# Patient Record
Sex: Female | Born: 1980 | Race: White | Hispanic: No | Marital: Single
Health system: Southern US, Academic
[De-identification: ages and names within clinical notes are randomized; demographics above are authoritative.]

## PROBLEM LIST (undated history)

## (undated) ENCOUNTER — Encounter: Attending: Dermatology | Primary: Dermatology

## (undated) ENCOUNTER — Ambulatory Visit

## (undated) ENCOUNTER — Other Ambulatory Visit

## (undated) ENCOUNTER — Encounter: Attending: Nurse Practitioner | Primary: Nurse Practitioner

## (undated) ENCOUNTER — Ambulatory Visit: Payer: PRIVATE HEALTH INSURANCE | Attending: Medical | Primary: Medical

## (undated) ENCOUNTER — Encounter

## (undated) ENCOUNTER — Encounter
Attending: Student in an Organized Health Care Education/Training Program | Primary: Student in an Organized Health Care Education/Training Program

## (undated) ENCOUNTER — Ambulatory Visit: Payer: Medicaid (Managed Care)

## (undated) ENCOUNTER — Telehealth
Attending: Student in an Organized Health Care Education/Training Program | Primary: Student in an Organized Health Care Education/Training Program

## (undated) ENCOUNTER — Encounter: Payer: PRIVATE HEALTH INSURANCE | Attending: Medical | Primary: Medical

## (undated) ENCOUNTER — Ambulatory Visit: Payer: PRIVATE HEALTH INSURANCE | Attending: Nurse Practitioner | Primary: Nurse Practitioner

## (undated) ENCOUNTER — Encounter: Attending: Medical | Primary: Medical

## (undated) ENCOUNTER — Telehealth

## (undated) ENCOUNTER — Ambulatory Visit: Payer: PRIVATE HEALTH INSURANCE | Attending: Obstetrics & Gynecology | Primary: Obstetrics & Gynecology

## (undated) ENCOUNTER — Encounter: Attending: Registered" | Primary: Registered"

## (undated) ENCOUNTER — Ambulatory Visit: Payer: PRIVATE HEALTH INSURANCE | Attending: Physician Assistant | Primary: Physician Assistant

## (undated) ENCOUNTER — Ambulatory Visit
Payer: Medicaid (Managed Care) | Attending: Student in an Organized Health Care Education/Training Program | Primary: Student in an Organized Health Care Education/Training Program

## (undated) ENCOUNTER — Ambulatory Visit: Payer: PRIVATE HEALTH INSURANCE

## (undated) ENCOUNTER — Telehealth: Attending: Obstetrics & Gynecology | Primary: Obstetrics & Gynecology

## (undated) ENCOUNTER — Telehealth: Attending: Internal Medicine | Primary: Internal Medicine

## (undated) ENCOUNTER — Encounter: Payer: PRIVATE HEALTH INSURANCE | Attending: Registered" | Primary: Registered"

## (undated) ENCOUNTER — Telehealth: Attending: Dermatology | Primary: Dermatology

## (undated) ENCOUNTER — Telehealth: Attending: Medical | Primary: Medical

## (undated) ENCOUNTER — Ambulatory Visit
Payer: PRIVATE HEALTH INSURANCE | Attending: Physical Medicine & Rehabilitation | Primary: Physical Medicine & Rehabilitation

## (undated) ENCOUNTER — Ambulatory Visit: Attending: Registered" | Primary: Registered"

## (undated) ENCOUNTER — Encounter: Payer: PRIVATE HEALTH INSURANCE | Attending: Internal Medicine | Primary: Internal Medicine

## (undated) ENCOUNTER — Encounter: Attending: Internal Medicine | Primary: Internal Medicine

## (undated) ENCOUNTER — Telehealth: Attending: Family | Primary: Family

## (undated) ENCOUNTER — Ambulatory Visit: Attending: Obstetrics & Gynecology | Primary: Obstetrics & Gynecology

## (undated) ENCOUNTER — Ambulatory Visit: Payer: Medicaid (Managed Care) | Attending: Obstetrics & Gynecology | Primary: Obstetrics & Gynecology

## (undated) ENCOUNTER — Encounter: Attending: Physician Assistant | Primary: Physician Assistant

## (undated) ENCOUNTER — Ambulatory Visit: Attending: Nurse Practitioner | Primary: Nurse Practitioner

## (undated) ENCOUNTER — Ambulatory Visit
Payer: PRIVATE HEALTH INSURANCE | Attending: Student in an Organized Health Care Education/Training Program | Primary: Student in an Organized Health Care Education/Training Program

## (undated) ENCOUNTER — Encounter: Payer: Medicaid (Managed Care) | Attending: Registered" | Primary: Registered"

## (undated) ENCOUNTER — Telehealth: Attending: Psychiatry | Primary: Psychiatry

## (undated) DIAGNOSIS — E119 Type 2 diabetes mellitus without complications: Secondary | ICD-10-CM

---

## 2006-08-01 ENCOUNTER — Emergency Department: Payer: Self-pay | Admitting: Emergency Medicine

## 2006-12-31 ENCOUNTER — Emergency Department: Payer: Self-pay | Admitting: Emergency Medicine

## 2012-05-13 ENCOUNTER — Emergency Department: Payer: Self-pay | Admitting: Emergency Medicine

## 2012-05-13 LAB — DIFFERENTIAL
Basophil %: 0.1 %
Eosinophil #: 0 10*3/uL (ref 0.0–0.7)
Monocyte #: 1.5 x10 3/mm — ABNORMAL HIGH (ref 0.2–0.9)
Monocyte %: 11.5 %
Neutrophil %: 81.1 %

## 2012-05-13 LAB — CBC
HCT: 35.4 % (ref 35.0–47.0)
MCH: 28.9 pg (ref 26.0–34.0)
MCHC: 33.1 g/dL (ref 32.0–36.0)
MCV: 87 fL (ref 80–100)
Platelet: 227 10*3/uL (ref 150–440)
RDW: 14.5 % (ref 11.5–14.5)
WBC: 13.2 10*3/uL — ABNORMAL HIGH (ref 3.6–11.0)

## 2012-05-13 LAB — URINALYSIS, COMPLETE
Glucose,UR: NEGATIVE mg/dL (ref 0–75)
Ph: 5 (ref 4.5–8.0)
Protein: 30
RBC,UR: 6 /HPF (ref 0–5)
Specific Gravity: 1.029 (ref 1.003–1.030)
Squamous Epithelial: 2
WBC UR: 6 /HPF (ref 0–5)

## 2012-05-13 LAB — BASIC METABOLIC PANEL
Anion Gap: 9 (ref 7–16)
BUN: 8 mg/dL (ref 7–18)
Calcium, Total: 8.6 mg/dL (ref 8.5–10.1)
Chloride: 101 mmol/L (ref 98–107)
Creatinine: 0.68 mg/dL (ref 0.60–1.30)
EGFR (African American): >60
Glucose: 102 mg/dL — ABNORMAL HIGH (ref 65–99)
Potassium: 3.8 mmol/L (ref 3.5–5.1)
Sodium: 133 mmol/L — ABNORMAL LOW (ref 136–145)

## 2012-05-13 LAB — HEPATIC FUNCTION PANEL A (ARMC)
Albumin: 2.9 g/dL — ABNORMAL LOW (ref 3.4–5.0)
Bilirubin,Total: 0.3 mg/dL (ref 0.2–1.0)
SGOT(AST): 16 U/L (ref 15–37)
Total Protein: 7.1 g/dL (ref 6.4–8.2)

## 2012-05-16 ENCOUNTER — Emergency Department: Payer: Self-pay | Admitting: Internal Medicine

## 2012-05-16 LAB — CBC
HGB: 11.4 g/dL — ABNORMAL LOW (ref 12.0–16.0)
MCHC: 32.1 g/dL (ref 32.0–36.0)
RBC: 4.05 10*6/uL (ref 3.80–5.20)
RDW: 14.6 % — ABNORMAL HIGH (ref 11.5–14.5)

## 2012-08-21 ENCOUNTER — Emergency Department: Payer: Self-pay | Admitting: Emergency Medicine

## 2012-08-21 LAB — URINALYSIS, COMPLETE
Bacteria: NONE SEEN
Bilirubin,UR: NEGATIVE
Glucose,UR: NEGATIVE mg/dL (ref 0–75)
Ketone: NEGATIVE
Leukocyte Esterase: NEGATIVE
RBC,UR: 5 /HPF (ref 0–5)
Squamous Epithelial: 3
WBC UR: 1 /HPF (ref 0–5)

## 2012-08-21 LAB — BASIC METABOLIC PANEL
BUN: 8 mg/dL (ref 7–18)
Calcium, Total: 8.8 mg/dL (ref 8.5–10.1)
Co2: 24 mmol/L (ref 21–32)
EGFR (African American): >60
Sodium: 139 mmol/L (ref 136–145)

## 2012-08-21 LAB — CBC
HGB: 11.9 g/dL — ABNORMAL LOW (ref 12.0–16.0)
MCH: 28.7 pg (ref 26.0–34.0)
Platelet: 257 10*3/uL (ref 150–440)
RBC: 4.13 10*6/uL (ref 3.80–5.20)
RDW: 14.6 % — ABNORMAL HIGH (ref 11.5–14.5)
WBC: 14.8 10*3/uL — ABNORMAL HIGH (ref 3.6–11.0)

## 2013-11-18 ENCOUNTER — Ambulatory Visit: Payer: Self-pay | Admitting: Physician Assistant

## 2013-11-30 ENCOUNTER — Emergency Department: Payer: Self-pay | Admitting: Emergency Medicine

## 2013-11-30 LAB — RAPID INFLUENZA A&B ANTIGENS

## 2013-12-25 ENCOUNTER — Encounter: Payer: Self-pay | Admitting: Obstetrics and Gynecology

## 2013-12-25 LAB — BASIC METABOLIC PANEL
Anion Gap: 8 (ref 7–16)
BUN: 5 mg/dL — AB (ref 7–18)
CO2: 24 mmol/L (ref 21–32)
CREATININE: 0.43 mg/dL — AB (ref 0.60–1.30)
Calcium, Total: 8.4 mg/dL — ABNORMAL LOW (ref 8.5–10.1)
Chloride: 105 mmol/L (ref 98–107)
EGFR (African American): >60
Glucose: 71 mg/dL (ref 65–99)
OSMOLALITY: 270 (ref 275–301)
Potassium: 4.1 mmol/L (ref 3.5–5.1)
Sodium: 137 mmol/L (ref 136–145)

## 2013-12-25 LAB — TSH: Thyroid Stimulating Horm: 2.09 u[IU]/mL

## 2013-12-28 ENCOUNTER — Encounter: Payer: Self-pay | Admitting: Obstetrics and Gynecology

## 2014-01-05 ENCOUNTER — Encounter: Payer: Self-pay | Admitting: Obstetrics and Gynecology

## 2014-01-26 ENCOUNTER — Ambulatory Visit: Payer: Self-pay | Admitting: Obstetrics and Gynecology

## 2014-01-26 ENCOUNTER — Encounter: Payer: Self-pay | Admitting: Maternal and Fetal Medicine

## 2014-02-18 ENCOUNTER — Ambulatory Visit: Payer: Self-pay | Admitting: Obstetrics and Gynecology

## 2014-03-23 ENCOUNTER — Inpatient Hospital Stay: Payer: Self-pay

## 2014-03-23 LAB — CBC WITH DIFFERENTIAL/PLATELET
Basophil #: 0.1 10*3/uL (ref 0.0–0.1)
Basophil %: 0.4 %
Eosinophil #: 0.2 10*3/uL (ref 0.0–0.7)
Eosinophil %: 1.8 %
HCT: 32.3 % — ABNORMAL LOW (ref 35.0–47.0)
HGB: 10.3 g/dL — AB (ref 12.0–16.0)
Lymphocyte #: 2 10*3/uL (ref 1.0–3.6)
Lymphocyte %: 15.3 %
MCH: 27.1 pg (ref 26.0–34.0)
MCHC: 32 g/dL (ref 32.0–36.0)
MCV: 85 fL (ref 80–100)
Monocyte #: 0.8 x10 3/mm (ref 0.2–0.9)
Monocyte %: 5.8 %
Neutrophil #: 10.2 10*3/uL — ABNORMAL HIGH (ref 1.4–6.5)
Neutrophil %: 76.7 %
Platelet: 258 10*3/uL (ref 150–440)
RBC: 3.81 10*6/uL (ref 3.80–5.20)
RDW: 16.2 % — AB (ref 11.5–14.5)
WBC: 13.2 10*3/uL — ABNORMAL HIGH (ref 3.6–11.0)

## 2014-03-25 LAB — HEMATOCRIT: HCT: 24.2 % — AB (ref 35.0–47.0)

## 2014-07-13 ENCOUNTER — Ambulatory Visit: Payer: Self-pay

## 2015-01-06 ENCOUNTER — Observation Stay: Payer: Self-pay | Admitting: Surgery

## 2015-01-08 ENCOUNTER — Ambulatory Visit: Payer: Self-pay | Admitting: Surgery

## 2015-03-20 NOTE — Consult Note (Signed)
Referral Information:  Reason for Referral Elevated BMI 47, 2 cm chronic labial lesion, smoker, abnormal tetra screen increased DSR   Referring Physician [facility]   Prenatal Hx 34 yo G4 p2012 unsure LMP 06/17/13,  [place] 03/30/14  by 21 1/7 on 12/23 /14 week [facility] scan now 26 3/7 close interval pregnancy  labial lesion noted at new ob - pt says it has been present for year s now, enlarging red, some discomfort with clothes and occ intercourse . biopsy in 2013 no malignant changes pt has a h/o hidradenitis suppurativa and I&D of pilonidal cyst- this is different. She reports inability to lose weight after big weight gain with first pregnancy. difficulty with smoking cessation. Hoping to get teeth fixed now that she has medicaid.   Past Obstetrical Hx 10/2000 female 7lb 15 oz spontaneous vaginal delivery [facility] 180 prepregnancy "Gained 100 lbs" 34 yo - nags her to quit smoking 6/201 SAB 5 weeks  03/2013 female 7 lb 13oz induction post dates [facility], failed epidural persistent back ache   Home Medications: Medication Instructions Status  prenatal vitamins 1   once a day Active   Allergies:   No Known Allergies:   Vital Signs/Notes:  Nursing Vital Signs: **Vital Signs.:   29-Jan-15 09:42  Vital Signs Type Routine; Perinatal clinic  Temperature Temperature (F) 97.5  Celsius 36.3  Pulse Pulse 112  Respirations Respirations 20  Systolic BP Systolic BP 272  Diastolic BP (mmHg) Diastolic BP (mmHg) 67  Mean BP 80  Pulse Ox % Pulse Ox % 97  Oxygen Delivery Room Air/ 21 %   Perinatal Consult:  LMP 17-Jun-2013   PGyn Hx labial lesion   PMed Hx Rubella Immune, Hx of varicella   Past Medical History cont'd h/o depression/ ADHD h/o gall bladder problems no stones just pain  h/o "tailbone cyst" requiring lancing  bad teeth - plannign on seeing a dentist  h/o smoking 1/2 ppd  h/u UTIs sleep apnea- had a CPAP machine at one point , snores   back pain relates to epidural   PSurg Hx I&D of cyst   FHx  htn , pos ppd, diabetes   Occupation Father recovery from ETOH went through a program while incarcerated- trying to decrease smoking down from 2ppd to 1 ppd   Soc Hx single, tobacco, committed relationship   Review Of Systems:  Medications/Allergies Reviewed Medications/Allergies reviewed   Exam:  Pelvic External left labia major at 5oclock 26m cystic structure subdermal jsut belwo surface    Additional Lab/Radiology Notes A pos antibody screen neg RPR neg, hep B neg  GC/CT neg varicella imm , rub imm pt reports normal early GCT   abnormal Tetra 1/250 DSR   Impression/Recommendations:  Impression IUP at 26 weeks ,prior good pregnancy outcomes obesity - BMI 47, h/o sleep apnea , normal early GCT , has another scheduled  labial lesion- benign appearing very superficial just below skin - not bartholins , annoying to pt rubs on clothes  smoker 1/2 ppd did better with Chantix, failed patches  - partner smokes 1ppd Increased risk for down syndrome 1/250 range met with gen counselor h/o back pain   Recommendations [facility] -advised to limit weight gain  1Check TSH , CMP p/c ratio as baseline- done today  2 Third tri glucola at [facility]  3 monthly u/s in third tri given inability to follow FH- scheduled in 4 weeks  4 recommended sleep study given high bmi and association of OSA  with pregnancy complications 5 I offered her a daily baby aspirin likely too late for preex prevention but may assist with increased risk of VTE 6 anesthesia consult to assess airway- pt refuses epidural 7 Consider resection of benign appearing  labial subdermal cystic  lesion with local at time of delivery  . 7seek dental care for poor dentition 8 needs contraceptive counseling   Plan:  Genetic Counseling yes   Ultrasound at what gestational ages Monthly > 28 weeks   Antepartum Testing Weekly, 36 weeks   Delivery Mode Vaginal   Additional Testing Thyroid  panel, glucola   Delivery at what gestational age [redacted] weeks    Total Time Spent with Patient 30 minutes   >50% of visit spent in couseling/coordination of care yes   Office Use Only 99242  Level 2 (25mn) NEW office consult exp prob focused   Coding Description: MATERNAL CONDITIONS/HISTORY INDICATION(S).   Obesity - BMI greater than equal to 30.  Electronic Signatures: LSharyn Creamer(MD)  (Signed 29-Jan-15 14:09)  Authored: Referral, Home Medications, Allergies, Vital Signs/Notes, Consult, Exam, Lab/Radiology Notes, Impression, Plan, Billing, Coding Description   Last Updated: 29-Jan-15 14:09 by LSharyn Creamer(MD)

## 2015-03-22 LAB — SURGICAL PATHOLOGY

## 2015-03-28 NOTE — H&P (Signed)
Subjective/Chief Complaint severe abdominal pain   History of Present Illness 34 y/o with long history of intermittent epigatsric pain which radiated into her right back. made herself vomit, took percocet 5/325 1-2 tabs po q 4-6 hr prn pain left over from surgery 1 month ago.  2 days ago severe pain similar which resolved.  Awoke at 4 am with the worst pain she has ever had.  came to ER.   Past History obesity s/p LEEP procedure.   Past Medical Health None   Primary Physician none   Code Status Full Code   Past Med/Surgical Hx:  miscarrage june 2013 at 5 wks:   LEEP:   ALLERGIES:  No Known Allergies:   Family and Social History:  Family History Hypertension   Social History positive  tobacco, negative ETOH, negative Illicit drugs, 1/2 to 1 pack per day   + Tobacco Current (within 1 year)   Place of Living Home   Review of Systems:  Subjective/Chief Complaint see above.   Abdominal Pain Yes   Nausea/Vomiting Yes   Tolerating Diet No  Nauseated   ROS remaining 10 point negative.   Medications/Allergies Reviewed Medications/Allergies reviewed   Physical Exam:  GEN no acute distress, obese, disheveled, 97.6 76 108/73   HEENT pale conjunctivae, PERRL   NECK supple   RESP normal resp effort   CARD no carotid bruits   ABD positive tenderness  denies Flank Tenderness  no hernia  soft  positive murphy's sign.   LYMPH negative neck   EXTR negative cyanosis/clubbing   SKIN normal to palpation, No rashes   NEURO cranial nerves intact   PSYCH A+O to time, place, person   Lab Results: Hepatic:  10-Feb-16 07:07   Bilirubin, Total 0.2  Alkaline Phosphatase 97  SGPT (ALT) 23  SGOT (AST)  12  Total Protein, Serum 7.2  Albumin, Serum  3.1  Routine Chem:  10-Feb-16 07:07   Lipase 148 (Result(s) reported on 06 Jan 2015 at 08:16AM.)  Glucose, Serum  120  BUN 13  Creatinine (comp) 0.89  Sodium, Serum 138  Potassium, Serum 4.3  Chloride, Serum 106  CO2,  Serum 25  Calcium (Total), Serum  8.4  Osmolality (calc) 277  eGFR (African American) >60  eGFR (Non-African American) >60 (eGFR values <36m/min/1.73 m2 may be an indication of chronic kidney disease (CKD). Calculated eGFR, using the MRDR Study equation, is useful in  patients with stable renal function. The eGFR calculation will not be reliable in acutely ill patients when serum creatinine is changing rapidly. It is not useful in patients on dialysis. The eGFR calculation may not be applicable to patients at the low and high extremes of body sizes, pregnant women, and vegetarians.)  Anion Gap 7  Cardiac:  10-Feb-16 07:07   Troponin I < 0.02 (0.00-0.05 0.05 ng/mL or less: NEGATIVE  Repeat testing in 3-6 hrs  if clinically indicated. >0.05 ng/mL: POTENTIAL  MYOCARDIAL INJURY. Repeat  testing in 3-6 hrs if  clinically indicated. NOTE: An increase or decrease  of 30% or more on serial  testing suggests a  clinically important change)  Routine UA:  10-Feb-16 07:07   Color (UA) Yellow  Clarity (UA) Clear  Glucose (UA) Negative  Bilirubin (UA) Negative  Ketones (UA) Negative  Specific Gravity (UA) 1.029  Blood (UA) Negative  pH (UA) 6.0  Protein (UA) Negative  Nitrite (UA) Negative  Leukocyte Esterase (UA) Negative (Result(s) reported [facility] 06 Jan 2015 [facility])  RBC (UA) 3 /HPF  WBC (UA) <1 /HPF  Bacteria (UA) NONE SEEN  Epithelial Cells (UA) 4 /HPF  Mucous (UA) PRESENT (Result(s) reported on 06 Jan 2015 at [facility].)  Routine Hem:  10-Feb-16 07:07   WBC (CBC)  15.2  RBC (CBC) 4.69  Hemoglobin (CBC) 12.1  Hematocrit (CBC) 37.4  Platelet Count (CBC) 305 (Result(s) reported on 06 Jan 2015 at 08:09AM.)  MCV 80  MCH  25.7  MCHC 32.3  RDW  16.9   Radiology Results: [place]:    10-Feb-16 08:53, US Abdomen Limited Survey  US Abdomen Limited Survey  REASON FOR EXAM:    uppwr abd and RUQ pain eval GB  COMMENTS:   Body Site: Gallbladder, Liver, Common Bile  Duct    PROCEDURE: [place]  - US ABDOMEN LIMITED SURVEY  - Jan 06 2015  8:53AM     CLINICAL DATA:  Right upper quadrant pain since 04/30 this morning  with nausea and vomiting    EXAM:  US ABDOMEN LIMITED - RIGHT UPPER QUADRANT    COMPARISON:  None.    FINDINGS:  Gallbladder:  There are multiple mobile gallstones. There is a stone present in  the gallbladder neck which is non mobile. The gallbladder wall is  top normal at 2.9 mm. There is no pericholecystic fluid. A  sonographic Murphy's sign could not be adequately assessed due to  the patient's medicated status.    Common bile duct:    Diameter: 7.5 mm which is at the upper limits of normal.    Liver:    The hepatic echotexture is mildly increased. There is no focal mass  or intrahepatic ductal dilation.   IMPRESSION:  Multiple mobile gallstones within an apparently impacted gallstone  in the gallbladder neck. Top-normal common bile duct diameter  without evidence of an intraluminal stone. Fatty infiltrative change  of the liver.      Electronically Signed    By: David  Martinique    On: 01/06/2015 08:59         Verified By: DAVID A. Martinique, M.D., MD  LabUnknown:  PACS Image    Assessment/Admission Diagnosis 34 y/o female with acute calculus cholecystitis stone lodged in neck of GB.   Plan admit, interval laparoscopic cholecystectomy. IV unasyn.   Electronic Signatures: Sherri Rad (MD)  (Signed 10-Feb-16 14:24)  Authored: CHIEF COMPLAINT and HISTORY, PAST MEDICAL/SURGIAL HISTORY, ALLERGIES, FAMILY AND SOCIAL HISTORY, REVIEW OF SYSTEMS, PHYSICAL EXAM, LABS, Radiology, ASSESSMENT AND PLAN   Last Updated: 10-Feb-16 14:24 by Sherri Rad (MD)

## 2015-03-28 NOTE — Op Note (Signed)
PATIENT NAME:  [name], [name] MR#:  [id number] DATE OF BIRTH:  12-06-1980  DATE OF PROCEDURE:  01/08/2015  PREOPERATIVE DIAGNOSIS: Biliary colic  POSTOPERATIVE DIAGNOSIS: Biliary colic.   PROCEDURE PERFORMED: Laparoscopic cholecystectomy.   SURGEON: Oluwatosin Bracy A. Egbert GaribaldiBird, MD   ASSISTANT: Claude MangesWilliam F. Marterre, MD  ANESTHESIA: General oroendotracheal.   FINDINGS: Nearly intrahepatic gallbladder, cholelithiasis.   SPECIMENS: Gallbladder with contents to pathology.   ESTIMATED BLOOD LOSS: 100 mL.   DRAINS: None.   COUNTS: LAP and needle count correct x 2.   DESCRIPTION OF PROCEDURE: With informed consent obtained from the patient, she was brought to the operating room and positioned supine. General oroendotracheal anesthesia was induced without difficulty. The patient's abdomen was widely and sterilely prepped and draped with ChloraPrep solution and a timeout was observed. A 12 mm blunt Hassan trocar was placed through an open technique through a supraumbilical, vertically oriented midline skin incision. The fascia was incised with a scalpel and elevated with Kocher clamps. Peritoneum being entered sharply the posterior layer. Stay sutures of 0 Vicryl were placed on either side of the abdominal midline fascia securing a 12 mm blunt Hassan trocar placed under direct visualization and pneumoperitoneum was established to a total of 15 mmHg. The patient was positioned in reverse Trendelenburg and airplane right side up. A 5 mm Bladeless trocar was placed in the epigastric region and two 5 mm trocars were then placed in the right lateral abdomen. The gallbladder was bilobed and intrahepatic. It was elevated towards the right liver, over the right liver and shoulder with a grasper on the midportion. Lateral traction was achieved on the infundibulum. The hepatoduodenal ligament was then incised with blunt technique with hook cautery apparatus utilizing the peritoneal layer both medially and laterally. The hilar  plate was identified. Cystic duct and cystic artery were identified. Critical view of safety cholecystectomy was achieved. There was a posterior branch of the cystic artery. The cystic duct was triply clipped on the portal side, singly clipped on the gallbladder side, and divided. The cystic artery was then individually divided in both anterior and posterior branches with hemoclips. The gallbladder was then retrieved off the gallbladder fossa utilizing hook cautery apparatus with some difficulty in tearing of the liver capsule due to it being intrahepatic. Hemostasis was obtained with the argon beam coagulator device. Surgiflo with thrombin was applied. Hemostasis appeared to be excellent. The gallbladder was retrieved with an Endo Catch device and retrieved successfully. With hemostasis being ensured on the operative field, all fluid was aspirated. Ports were then removed under direct visualization. The supraumbilical fascial defect was closed with multiple simple and figure-of-eight #0 Vicryl sutures. The existing stay sutures tied to each other. A total of 20 mL of 0.25% plain Marcaine was infiltrated along all skin and fascial incisions prior to closure; 4-0 Vicryl subcuticular was applied to all skin edges followed by the application of benzoin, Steri-Strips, Telfa, and Tegaderm. The patient was subsequently extubated and taken to the recovery room in stable and satisfactory condition by anesthesia services.     ____________________________ Redge GainerMark A. Egbert GaribaldiBird, MD mab:bm D: 01/09/2015 17:19:23 ET T: 01/09/2015 23:02:24 ET JOB#: [id number]  cc: Loraine LericheMark A. Egbert GaribaldiBird, MD, <Dictator> Raynald KempMARK A Joeanthony Seeling MD ELECTRONICALLY SIGNED 01/12/2015 13:56

## 2015-04-06 NOTE — H&P (Signed)
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   Gravida 4   Term 2   PreTerm 0   Abortion 1   Living 2   Blood Type (Maternal) A positive   Group B Strep Results Maternal (Result >5wks must be treated as unknown) negative   Maternal HIV Negative   Maternal Syphilis Ab Nonreactive   Maternal Varicella Immune   Rubella Results (Maternal) immune   Maternal T-Dap Immune   EDC 24-Mar-2014   Presents with contractions, for induction   Patient's Medical History Diabetes  gest, obesity   Patient's Surgical History none   Medications Pre Natal Vitamins   Allergies NKDA   Social History tobacco  1/2 ppd at least   Family History Non-Contributory   Current Prenatal Course Notable For gestational diabetes, obesity   ROS:  ROS All systems were reviewed.  HEENT, CNS, GI, GU, Respiratory, CV, Renal and Musculoskeletal systems were found to be normal.   Exam:  Vital Signs stable   General no apparent distress   Mental Status clear   Chest clear   Heart normal sinus rhythm   Abdomen gravid, non-tender   Estimated Fetal Weight Large for gestational age   Fetal Position v   Fundal Height unable to measure   Back no CVAT   Edema 1+   Reflexes 1+   Pelvic no external lesions   Mebranes Intact   Description clear   FHT normal rate with no decels    Fetal Heart Rate 150   Ucx irregular   Skin dry   Lymph no lymphadenopathy   Impression:  Impression admit for induction   Plan:  Plan monitor contractions and for cervical change   Comments admit and start pitocin and check clucoses every two hours until in ,.labor and then q1 hr,   Follow Up Appointment need to schedule. in 6 weeks   Electronic Signatures: Adria DevonKlett, Hyland Mollenkopf (MD)  (Signed 27-Apr-15 09:12)  Authored: L&D Evaluation   Last Updated: 27-Apr-15 09:12 by Adria DevonKlett, Jovon Winterhalter (MD)

## 2015-10-30 IMAGING — US US OB US >=[ID] SNGL FETUS
1 series · 13 of 28 positions shown · non-contrast
Comparison: none

CLINICAL DATA: Pregnancy.

EXAM:
ULTRASOUND OB >=5GCCV SINGLE FETUS

[Series 1: us ob us >=(id) sngl fetus · 0.22mm/px · 13 of 80 slices shown]
[im 3/80]
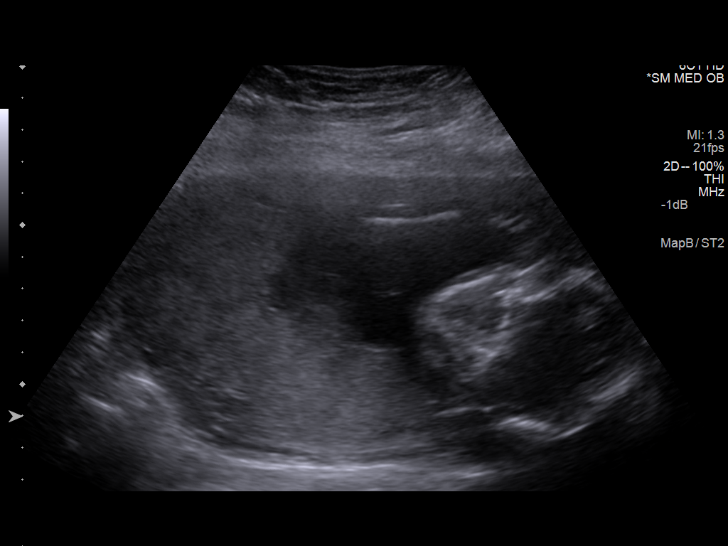
[im 9/80]
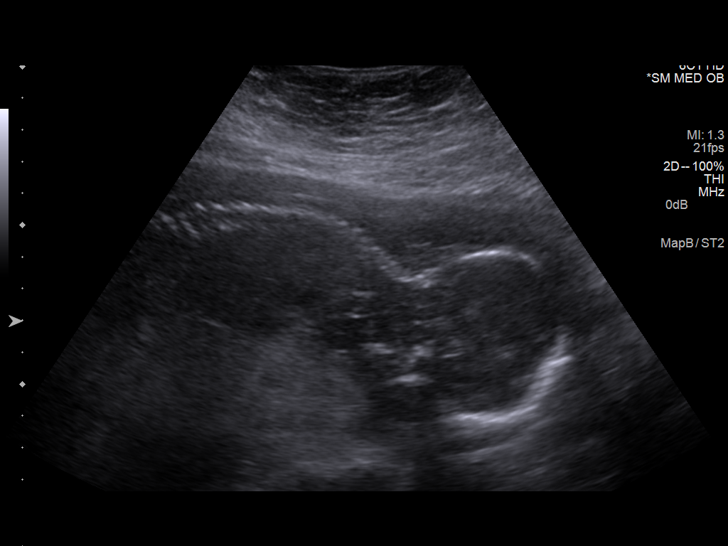
[im 15/80]
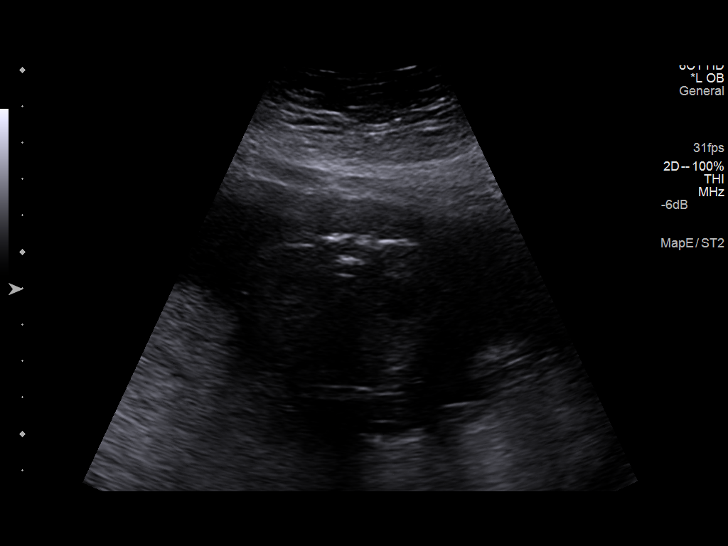
[im 21/80]
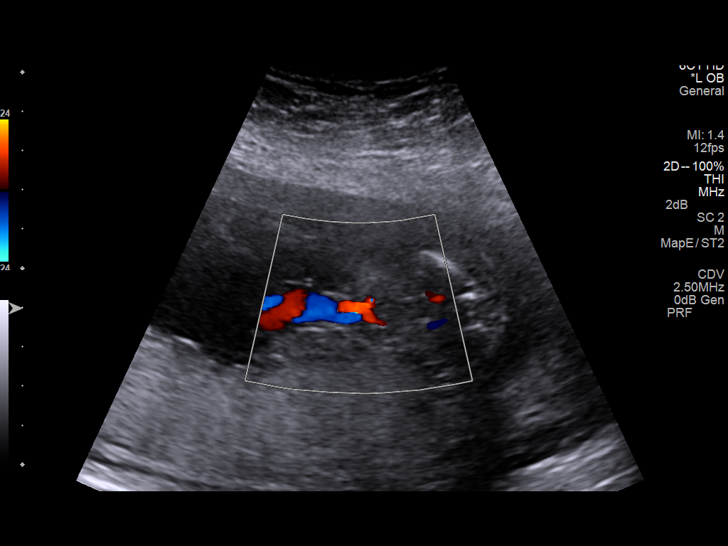
[im 27/80]
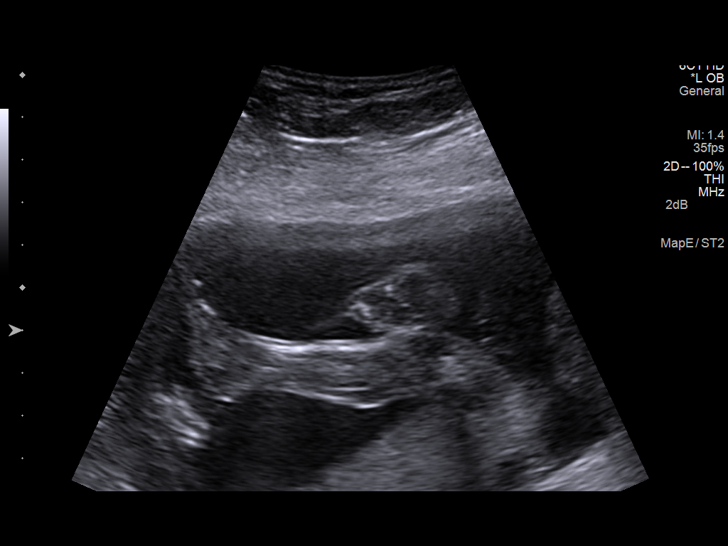
[im 33/80]
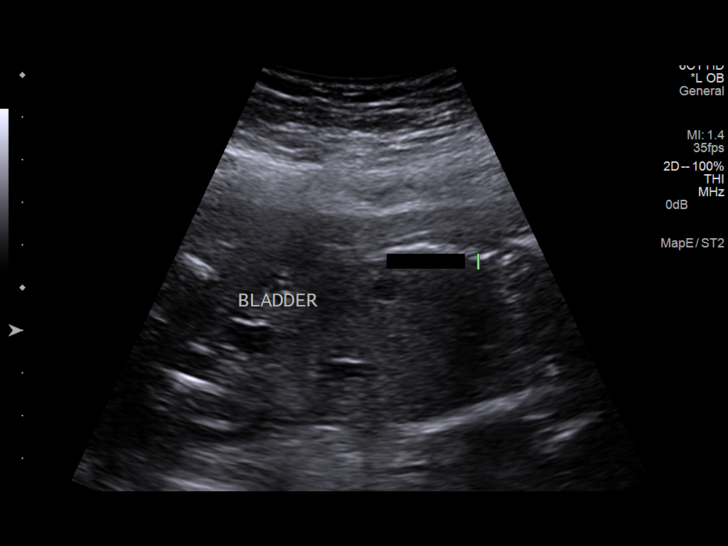
[im 41/80]
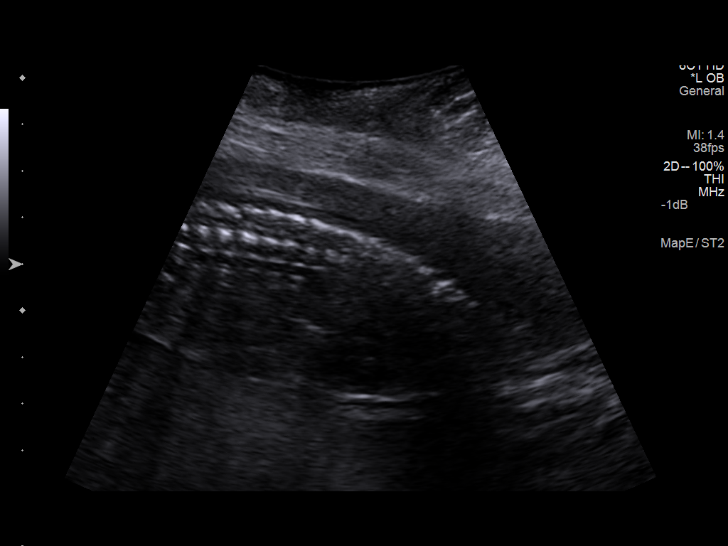
[im 47/80]
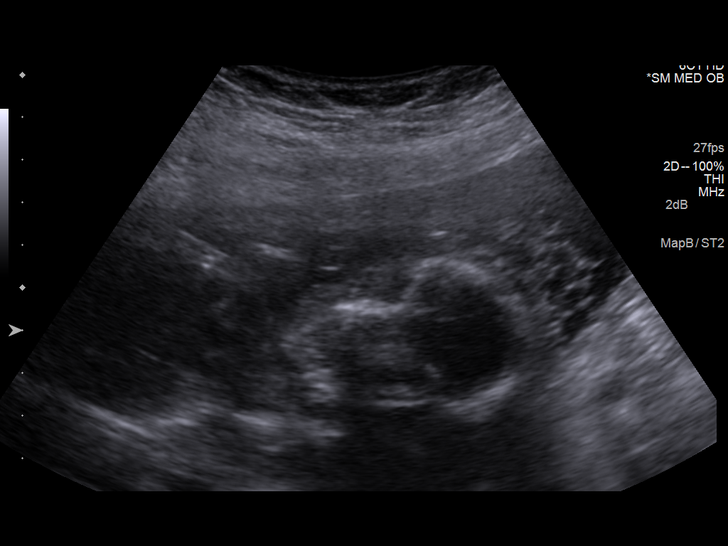
[im 53/80]
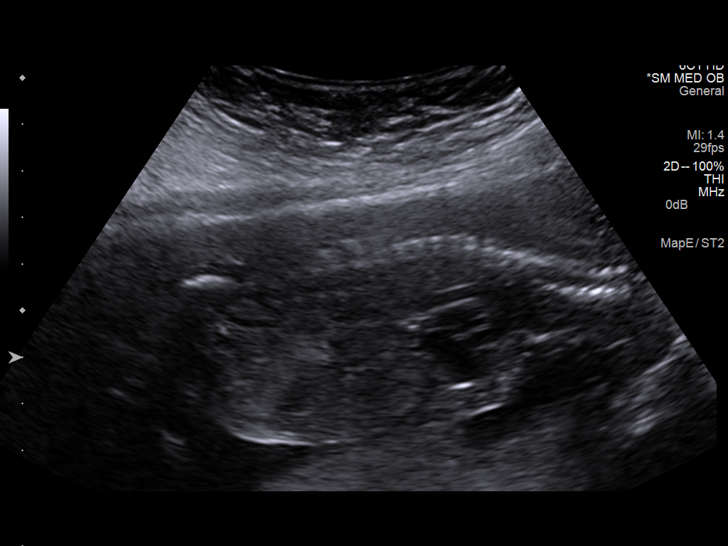
[im 59/80]
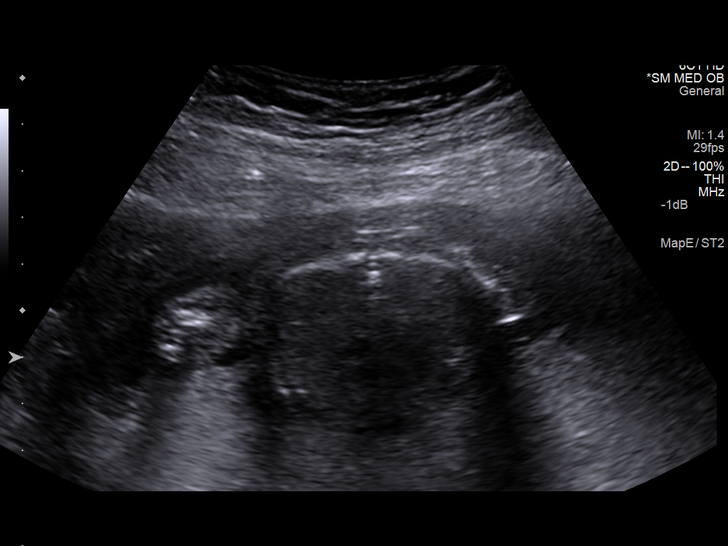
[im 65/80]
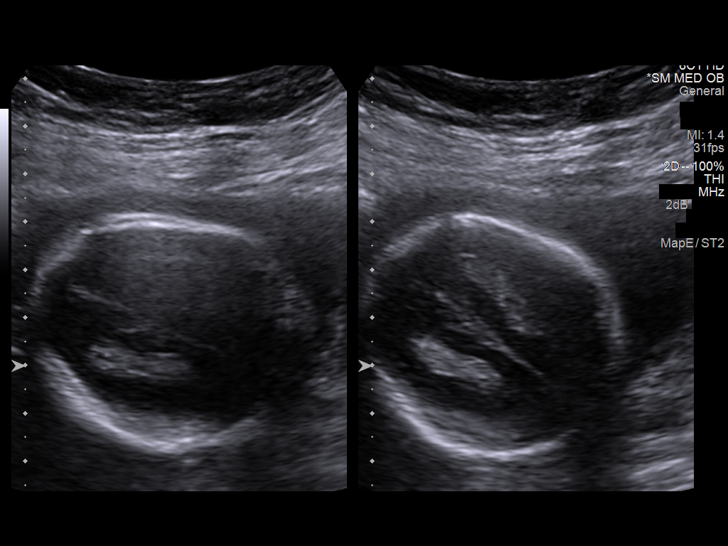
[im 71/80]
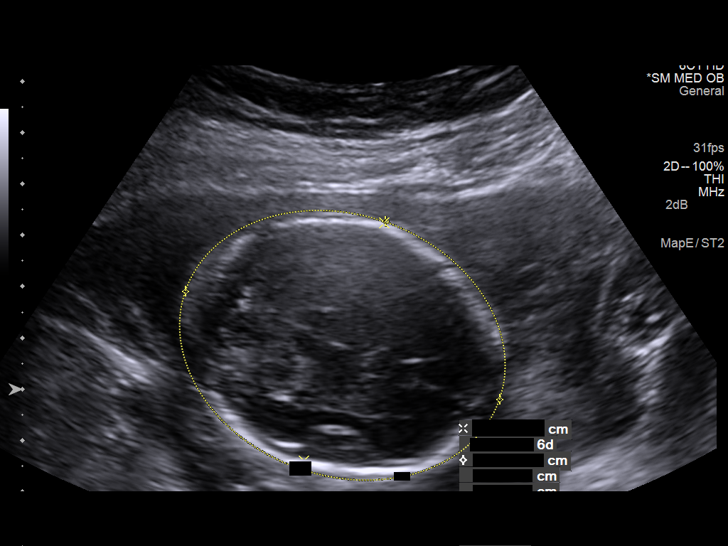
[im 77/80]
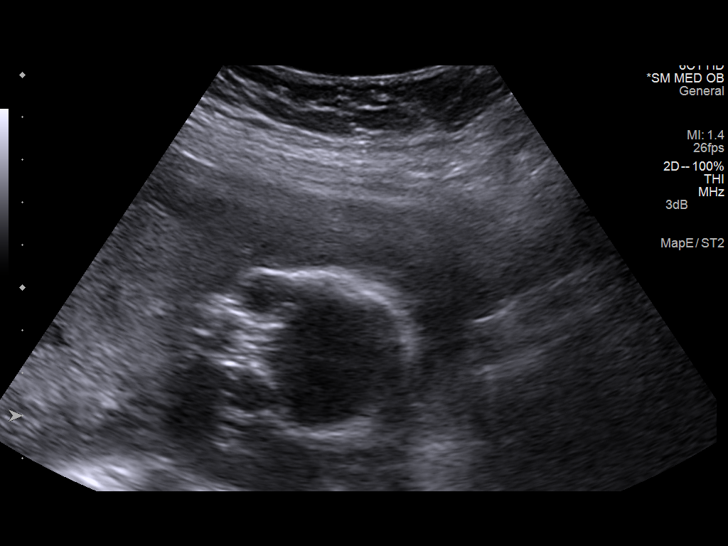

[13 of 28 positions shown; findings below may reference images not displayed]

FINDINGS: Single viable intrauterine pregnancy is present with active cardiac,
trauma, extremity motion. Fetal heart rate 155 beats per min noted.
Fetal presentation is transverse. Placenta is posterior. Cervical
length is 5.5 cm. Placenta cervix distant 6.4 cm. Amniotic fluid
volume is normal. Visualized fetal head, spine, chest, abdomen, and
pelvis are normal. This is extremely limited exam due to patient's
size. Given patient's morbid obesity ultrasound at a tertiary care
center should be considered.

Fetal gestational age parameters included mean BPD 4.9 cm
corresponding to 20 weeks 5 days, head circumference 18.4 cm
corresponding to 20 weeks 5 days, abdominal circumference 16.4 cm
corresponding to 20 weeks 3 days, femur length 3.5 cm corresponding
to 21 weeks 1 day, humeral length 3.4 cm corresponding to 21 weeks 3
days.
IMPRESSION: Single viable intrauterine pregnancy in transverse presentation with
mean gestational age by ultrasound of 21 weeks weeks 1 day. This is
extremely limited exam due to patient's morbid obesity. Ultrasound
at a tertiary care center should be considered.

## 2016-12-18 ENCOUNTER — Encounter: Payer: Self-pay | Admitting: Emergency Medicine

## 2016-12-18 ENCOUNTER — Emergency Department
Admission: EM | Admit: 2016-12-18 | Discharge: 2016-12-18 | Disposition: A | Discharge status: Home / Self Care | Payer: Medicaid Other | Attending: Emergency Medicine | Admitting: Emergency Medicine

## 2016-12-18 DIAGNOSIS — K0889 Other specified disorders of teeth and supporting structures: Secondary | ICD-10-CM

## 2016-12-18 DIAGNOSIS — K029 Dental caries, unspecified: Secondary | ICD-10-CM | POA: Insufficient documentation

## 2016-12-18 MED ORDER — AMOXICILLIN 500 MG PO CAPS: 500.0000 mg | ORAL_CAPSULE | Freq: Three times a day (TID) | ORAL | 0 refills | Status: AC

## 2016-12-18 MED ORDER — TRAMADOL HCL 50 MG PO TABS: 50.0000 mg | ORAL_TABLET | Freq: Two times a day (BID) | ORAL | 0 refills | Status: AC | PRN

## 2016-12-18 NOTE — ED Provider Notes (Signed)
[facility] Emergency Department Provider Note  ____________________________________________  Time seen: Approximately 8:03 PM  I have reviewed the triage vital signs and the nursing notes.   HISTORY  Chief Complaint Dental Pain    HPI [name] [name] is a 36 y.o. female presenting with superior 09, 10 and 11 pain. Patient has noticed gingival erythema and edema surrounding aforementioned teeth. She has been afebrile. She has a dental appointment for 12/27/2016. Patient currently rates her pain at 9 out of 10 in intensity. Patient states that teeth pain is impacting her activities of daily living. She denies chest pain, shortness of breath, nausea, abdominal pain and vomiting. Patient has tried Listerine but has attempted no other alleviating measures.   History reviewed. No pertinent past medical history.  There are no active problems to display for this patient.   No past surgical history on file.  Prior to Admission medications   Medication Sig Start Date End Date Taking? Authorizing Provider  amoxicillin (AMOXIL) 500 MG capsule Take 1 capsule (500 mg total) by mouth 3 (three) times daily. 12/18/16 12/28/16  Orvil Feil, PA-C  traMADol (ULTRAM) 50 MG tablet Take 1 tablet (50 mg total) by mouth every 12 (twelve) hours as needed. 12/18/16 12/23/16  Orvil Feil, PA-C    Allergies Patient has no known allergies.  No family history on file.  Social History Social History  Substance Use Topics  . Smoking status: Not on file  . Smokeless tobacco: Not on file  . Alcohol use Not on file     Review of Systems  Constitutional: No fever/chills ENT: Patient has superior 09,10 and 11 pain.  Cardiovascular: no chest pain. Respiratory: no cough. No SOB. Skin: She has no edema of the skin overlying the jaw bilaterally. Neurological: Negative for headaches, focal weakness or numbness.   ____________________________________________   PHYSICAL  EXAM:  VITAL SIGNS: ED Triage Vitals  Enc Vitals Group     BP 12/18/16 1835 125/88     Pulse Rate 12/18/16 1835 88     Resp 12/18/16 1835 18     Temp 12/18/16 1835 98 F (36.7 C)     Temp Source 12/18/16 1835 Oral     SpO2 12/18/16 1835 96 %     Weight 12/18/16 1835 300 lb (136.1 kg)     Height 12/18/16 1835 5\' 4"  (1.626 m)     Head Circumference --      Peak Flow --      Pain Score 12/18/16 1836 9     Pain Loc --      Pain Edu? --      Excl. in GC? --      Constitutional: Alert and oriented. Well appearing and in no acute distress.      Mouth/Throat: Mucous membranes are moist. Patient has gingival erythema and mild edema surrounding superior 9, 10 and 11. Dental carries visualized. Neck: FROM. No cervical lymphadenopathy. Cardiovascular: Normal rate, regular rhythm. Normal S1 and S2.  Good peripheral circulation. Respiratory: Normal respiratory effort without tachypnea or retractions. Lungs CTAB. Good air entry to the bases with no decreased or absent breath sounds. Neurologic:  Normal speech and language. No gross focal neurologic deficits are appreciated.  Musculoskeletal: Pain is elicited with TMJ testing bilaterally. Skin:  Skin overlying the jaw is symmetrical and without significant edema. Psychiatric: Mood and affect are normal. Speech and behavior are normal. Patient exhibits appropriate insight and judgement.   ____________________________________________   LABS (all labs ordered are  listed, but only abnormal results are displayed)  Labs Reviewed - No data to display ____________________________________________  EKG   ____________________________________________  RADIOLOGY   No results found.  ____________________________________________    PROCEDURES  Procedure(s) performed:    Procedures    Medications - No data to display   ____________________________________________   INITIAL IMPRESSION / ASSESSMENT AND PLAN / ED  COURSE  Pertinent labs & imaging results that were available during my care of the patient were reviewed by me and considered in my medical decision making (see chart for details).  Review of the [place] CSRS was performed in accordance of the [facility] prior to dispensing any controlled drugs.    Assessment and plan: Dental pain: Patient presents to emergency department with superior 9, 10 and 11 pain. On physical exam, gingival erythema and edema were visualized at aforementioned teeth. Patient denies fever. Patient was discharged with amoxicillin and a short course of tramadol. Patient was advised to keep her dental appointment on 12/27/2016. Vital signs are reassuring at this time. All patient questions were answered. ____________________________________________  FINAL CLINICAL IMPRESSION(S) / ED DIAGNOSES  Final diagnoses:  Pain, dental      NEW MEDICATIONS STARTED DURING THIS VISIT:  Discharge Medication List as of 12/18/2016  8:14 PM    START taking these medications   Details  amoxicillin (AMOXIL) 500 MG capsule Take 1 capsule (500 mg total) by mouth 3 (three) times daily., Starting Mon 12/18/2016, Until Thu 12/28/2016, Print    traMADol (ULTRAM) 50 MG tablet Take 1 tablet (50 mg total) by mouth every 12 (twelve) hours as needed., Starting Mon 12/18/2016, Until Sat 12/23/2016, Print            This chart was dictated using voice recognition software/Dragon. Despite best efforts to proofread, errors can occur which can change the meaning. Any change was purely unintentional.    Orvil FeilJaclyn M Rayne Loiseau, PA-C 12/19/16 0050    Minna AntisKevin Paduchowski, MD 12/22/16 1525

## 2016-12-18 NOTE — ED Notes (Signed)
Pt alert and oriented X4, active, cooperative, pt in NAD. RR even and unlabored, color WNL.  Pt informed to return if any life threatening symptoms occur.   

## 2016-12-18 NOTE — ED Triage Notes (Signed)
Pt reports left side upper dental pain for two days.

## 2017-03-07 ENCOUNTER — Emergency Department: Payer: Medicaid Other

## 2017-03-07 DIAGNOSIS — R0602 Shortness of breath: Secondary | ICD-10-CM | POA: Insufficient documentation

## 2017-03-07 DIAGNOSIS — Z5321 Procedure and treatment not carried out due to patient leaving prior to being seen by health care provider: Secondary | ICD-10-CM | POA: Insufficient documentation

## 2017-03-07 NOTE — ED Triage Notes (Signed)
Patient reports had cold symptoms a few weeks ago with cough that seemed to clear.  Reports over past 3-4 days when coughs she can't stop and becomes short of breath.

## 2017-03-08 ENCOUNTER — Emergency Department
Admission: EM | Admit: 2017-03-08 | Discharge: 2017-03-08 | Disposition: A | Discharge status: Home / Self Care | Payer: Medicaid Other | Attending: Emergency Medicine | Admitting: Emergency Medicine

## 2017-07-07 ENCOUNTER — Emergency Department
Admission: EM | Admit: 2017-07-07 | Discharge: 2017-07-07 | Discharge status: Against Medical Advice | Payer: Medicaid Other | Attending: Emergency Medicine | Admitting: Emergency Medicine

## 2017-07-07 DIAGNOSIS — W5503XA Scratched by cat, initial encounter: Secondary | ICD-10-CM | POA: Insufficient documentation

## 2017-07-07 DIAGNOSIS — L03114 Cellulitis of left upper limb: Secondary | ICD-10-CM | POA: Insufficient documentation

## 2017-07-07 DIAGNOSIS — M79602 Pain in left arm: Secondary | ICD-10-CM | POA: Diagnosis present

## 2017-07-07 MED ORDER — AMOXICILLIN-POT CLAVULANATE 875-125 MG PO TABS: 1.0000 | ORAL_TABLET | Freq: Two times a day (BID) | ORAL | 0 refills | Status: AC

## 2017-07-07 NOTE — ED Provider Notes (Signed)
[facility] Emergency Department Provider Note  ____________________________________________  Time seen: Approximately 3:27 PM  I have reviewed the triage vital signs and the nursing notes.   HISTORY  Chief Complaint Insect Bite    HPI [name] [name] is a 36 y.o. female presenting to the emergency department with 2 regions of circumferential cellulitis on the left forearm after patient was scratched by her cat 2 days ago. Patient denies fever, chills nausea or vomiting. No radiating measures have been attempted.   No past medical history on file.  There are no active problems to display for this patient.   No past surgical history on file.  Prior to Admission medications   Medication Sig Start Date End Date Taking? Authorizing Provider  amoxicillin-clavulanate (AUGMENTIN) 875-125 MG tablet Take 1 tablet by mouth 2 (two) times daily. 07/07/17 07/17/17  Orvil Feil, PA-C    Allergies Patient has no known allergies.  No family history on file.  Social History Social History  Substance Use Topics  . Smoking status: Not on file  . Smokeless tobacco: Not on file  . Alcohol use Not on file     Review of Systems  Constitutional: No fever/chills Eyes: No visual changes. No discharge ENT: No upper respiratory complaints. Cardiovascular: no chest pain. Respiratory: no cough. No SOB. Gastrointestinal: No abdominal pain.  No nausea, no vomiting.  No diarrhea.  No constipation. Musculoskeletal: Negative for musculoskeletal pain. Skin:Patient has 2 regions of cellulitis. Neurological: Negative for headaches, focal weakness or numbness.   ____________________________________________   PHYSICAL EXAM:  VITAL SIGNS: ED Triage Vitals  Enc Vitals Group     BP 07/07/17 1442 128/73     Pulse Rate 07/07/17 1442 79     Resp 07/07/17 1442 18     Temp 07/07/17 1442 98.3 F (36.8 C)     Temp Source 07/07/17 1442 Oral     SpO2 07/07/17 1442 96 %   Weight 07/07/17 1441 298 lb (135.2 kg)     Height 07/07/17 1441 5\' 4"  (1.626 m)     Head Circumference --      Peak Flow --      Pain Score 07/07/17 1441 4     Pain Loc --      Pain Edu? --      Excl. in GC? --      Constitutional: Alert and oriented. Well appearing and in no acute distress. Eyes: Conjunctivae are normal. PERRL. EOMI. Head: Atraumatic. Cardiovascular: Normal rate, regular rhythm. Normal S1 and S2.  Good peripheral circulation. Respiratory: Normal respiratory effort without tachypnea or retractions. Lungs CTAB. Good air entry to the bases with no decreased or absent breath sounds. Musculoskeletal: Full range of motion to all extremities. No gross deformities appreciated. Neurologic:  Normal speech and language. No gross focal neurologic deficits are appreciated.  Skin: Patient has 2 1.5 cm regions of circumferential cellulitis along the left forearm without induration or palpable fluctuance. Palpable radial and ulnar pulses, left. Psychiatric: Mood and affect are normal. Speech and behavior are normal. Patient exhibits appropriate insight and judgement.   ____________________________________________   LABS (all labs ordered are listed, but only abnormal results are displayed)  Labs Reviewed - No data to display ____________________________________________  EKG   ____________________________________________  RADIOLOGY   No results found.  ____________________________________________    PROCEDURES  Procedure(s) performed:    Procedures    Medications - No data to display   ____________________________________________   INITIAL IMPRESSION / ASSESSMENT AND PLAN /  ED COURSE  Pertinent labs & imaging results that were available during my care of the patient were reviewed by me and considered in my medical decision making (see chart for details).  Review of the [place] CSRS was performed in accordance of the [facility] prior to dispensing any controlled  drugs.    Assessment and plan Cat scratch Patient presents to the emergency department with 2 regions of circumferential cellulitis along the left forearm. Patient declined ultrasound examination in the emergency department to detect possible intramuscular loculations AGAINST MEDICAL ADVICE. Patient was given strict return precautions to return to the emergency department for worsening erythema. She was discharged with Augmentin. Vital signs are reassuring prior to discharge. All patient questions were answered.     ____________________________________________  FINAL CLINICAL IMPRESSION(S) / ED DIAGNOSES  Final diagnoses:  Cat scratch      NEW MEDICATIONS STARTED DURING THIS VISIT:  New Prescriptions   AMOXICILLIN-CLAVULANATE (AUGMENTIN) 875-125 MG TABLET    Take 1 tablet by mouth 2 (two) times daily.        This chart was dictated using voice recognition software/Dragon. Despite best efforts to proofread, errors can occur which can change the meaning. Any change was purely unintentional.    Orvil FeilWoods, Nash Bolls M, PA-C 07/07/17 1552    Sharyn CreamerQuale, Mark, MD 07/07/17 [phone]

## 2017-07-07 NOTE — ED Triage Notes (Signed)
Pt reports painful and itching insect bite to left arm for two days.

## 2018-09-07 ENCOUNTER — Encounter: Payer: Self-pay | Admitting: Emergency Medicine

## 2018-09-07 ENCOUNTER — Emergency Department
Admission: EM | Admit: 2018-09-07 | Discharge: 2018-09-07 | Disposition: A | Discharge status: Home / Self Care | Payer: Self-pay | Attending: Emergency Medicine | Admitting: Emergency Medicine

## 2018-09-07 ENCOUNTER — Other Ambulatory Visit: Payer: Self-pay

## 2018-09-07 DIAGNOSIS — J020 Streptococcal pharyngitis: Secondary | ICD-10-CM | POA: Insufficient documentation

## 2018-09-07 LAB — INFLUENZA PANEL BY PCR (TYPE A & B)
Influenza A By PCR: NEGATIVE
Influenza B By PCR: NEGATIVE

## 2018-09-07 MED ORDER — AMOXICILLIN 875 MG PO TABS: 875.0000 mg | ORAL_TABLET | Freq: Two times a day (BID) | ORAL | 0 refills | Status: AC

## 2018-09-07 NOTE — ED Triage Notes (Signed)
Pt to ed with c/o fever, cough, congestion, sore throat that started yesterday.  Pt states she took tylenol about 1 1/2 hours pta.

## 2018-09-07 NOTE — ED Provider Notes (Signed)
[facility] Emergency Department Provider Note  ____________________________________________  Time seen: Approximately 4:53 PM  I have reviewed the triage vital signs and the nursing notes.   HISTORY  Chief Complaint Cough; Sore Throat; Generalized Body Aches; and Fever    HPI [name] [name] is a 37 y.o. female presents to the emergency department with occasional cough, 8 out of 10 pharyngitis, fever, body aches, abdominal discomfort and headache for the past 2 days.  Patient's children have recently been diagnosed with strep pharyngitis.  Patient has had fever and chills at home.  She is able to tolerate fluids and is managing her own secretions.  No alleviating measures have been attempted.   History reviewed. No pertinent past medical history.  There are no active problems to display for this patient.   History reviewed. No pertinent surgical history.  Prior to Admission medications   Medication Sig Start Date End Date Taking? Authorizing Provider  amoxicillin (AMOXIL) 875 MG tablet Take 1 tablet (875 mg total) by mouth 2 (two) times daily for 7 days. 09/07/18 09/14/18  Orvil Feil, PA-C    Allergies Patient has no known allergies.  No family history on file.  Social History Social History   Tobacco Use  . Smoking status: Never Smoker  . Smokeless tobacco: Never Used  Substance Use Topics  . Alcohol use: Yes  . Drug use: Never     Review of Systems  Constitutional: Patient has fever.  Eyes: No visual changes. No discharge ENT: Patient has pharyngitis.  Cardiovascular: no chest pain. Respiratory: no cough. No SOB. Gastrointestinal: No abdominal pain.  No nausea, no vomiting.  No diarrhea.  No constipation. Genitourinary: Negative for dysuria. No hematuria Musculoskeletal: Negative for musculoskeletal pain. Skin: Negative for rash, abrasions, lacerations, ecchymosis. Neurological: Negative for headaches, focal weakness or  numbness.   ____________________________________________   PHYSICAL EXAM:  VITAL SIGNS: ED Triage Vitals  Enc Vitals Group     BP 09/07/18 1543 (!) 127/93     Pulse Rate 09/07/18 1543 (!) 118     Resp 09/07/18 1543 18     Temp 09/07/18 1543 98.6 F (37 C)     Temp Source 09/07/18 1543 Oral     SpO2 09/07/18 1543 95 %     Weight 09/07/18 1544 300 lb (136.1 kg)     Height 09/07/18 1544 5\' 4"  (1.626 m)     Head Circumference --      Peak Flow --      Pain Score 09/07/18 1543 7     Pain Loc --      Pain Edu? --      Excl. in GC? --      Constitutional: Alert and oriented. Well appearing and in no acute distress. Eyes: Conjunctivae are normal. PERRL. EOMI. Head: Atraumatic. ENT:      Ears: Right tympanic membrane is erythematous and bulging with evidence of purulent exudate behind TM.      Nose: No congestion/rhinnorhea.      Mouth/Throat: Mucous membranes are moist.  Posterior pharynx is erythematous with petechiae and bilateral tonsillar exudate and hypertrophy.  Uvula is midline. Neck: No stridor.  No cervical spine tenderness to palpation. Hematological/Lymphatic/Immunilogical: Palpable cervical lymphadenopathy.  Cardiovascular: Normal rate, regular rhythm. Normal S1 and S2.  Good peripheral circulation. Respiratory: Normal respiratory effort without tachypnea or retractions. Lungs CTAB. Good air entry to the bases with no decreased or absent breath sounds. Musculoskeletal: Full range of motion to all extremities. No gross deformities  appreciated. Neurologic:  Normal speech and language. No gross focal neurologic deficits are appreciated.  Skin:  Skin is warm, dry and intact. No rash noted. Psychiatric: Mood and affect are normal. Speech and behavior are normal. Patient exhibits appropriate insight and judgement.   ____________________________________________   LABS (all labs ordered are listed, but only abnormal results are displayed)  Labs Reviewed  INFLUENZA  PANEL BY PCR (TYPE A & B)   ____________________________________________  EKG   ____________________________________________  RADIOLOGY   No results found.  ____________________________________________    PROCEDURES  Procedure(s) performed:    Procedures    Medications - No data to display   ____________________________________________   INITIAL IMPRESSION / ASSESSMENT AND PLAN / ED COURSE  Pertinent labs & imaging results that were available during my care of the patient were reviewed by me and considered in my medical decision making (see chart for details).  Review of the [place] CSRS was performed in accordance of the [facility] prior to dispensing any controlled drugs.      Assessment and plan Strep pharyngitis Patient presents to the emergency department with headache, pharyngitis, fever, chills and myalgias.  History and physical exam findings were consistent with strep pharyngitis.  Patient has known contacts within her home with strep pharyngitis.  Patient was treated empirically with amoxicillin.  She was advised to follow-up with primary care as needed.     ____________________________________________  FINAL CLINICAL IMPRESSION(S) / ED DIAGNOSES  Final diagnoses:  Strep pharyngitis      NEW MEDICATIONS STARTED DURING THIS VISIT:  ED Discharge Orders         Ordered    amoxicillin (AMOXIL) 875 MG tablet  2 times daily     09/07/18 1646              This chart was dictated using voice recognition software/Dragon. Despite best efforts to proofread, errors can occur which can change the meaning. Any change was purely unintentional.    Orvil Feil, PA-C 09/07/18 1657    Jene Every, MD 09/07/18 [phone]

## 2018-09-07 NOTE — ED Notes (Signed)
Pt states children were tested positive for something, she cannot recall what it was other than "beta" which she said they told her was "like strep, but not strep". Pt is a smoker, dx w/ COPD. Pt has no doctor and is untreated for chronic illness. Pt has a stuffy nose, non-productive cough, fever, myalgias, sore throat w/ swelling. Pt c/o vomiting. Pt states bilateral legs are very swollen today.

## 2019-02-16 IMAGING — CR DG CHEST 2V
1 series · 2 of 2 positions shown · non-contrast
Comparison: None.

CLINICAL DATA: Patient reports had cold symptoms a few weeks ago
with cough that seemed to clear. Reports over past 3-4 days when
coughs she can't stop and becomes short of breath. No hx of heart or
lung issues. smoker

EXAM:
CHEST  2 VIEW

[Series 1: dg chest 2 view · 0.14mm/px · 2 of 2 slices shown]
[im 1/2]
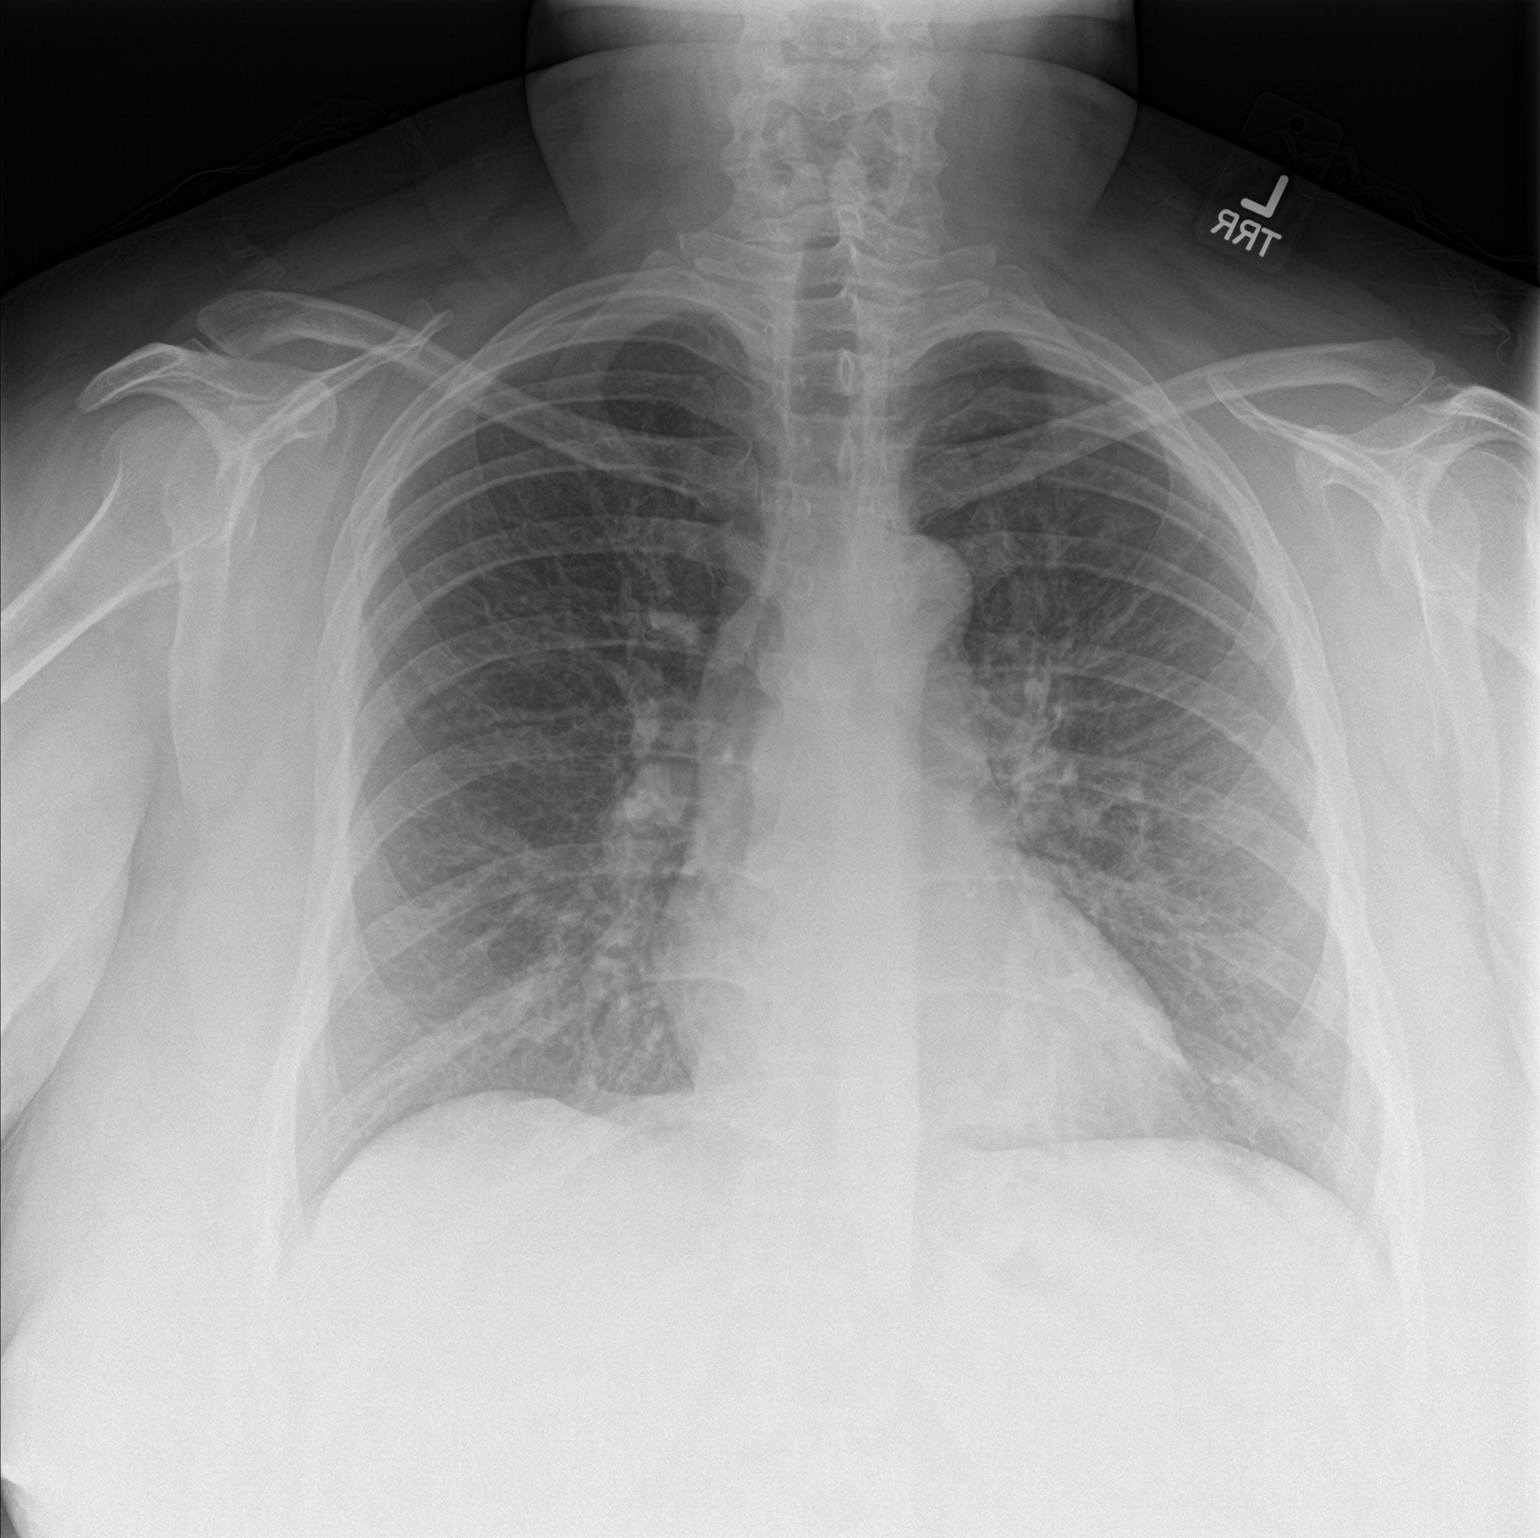
[im 2/2]
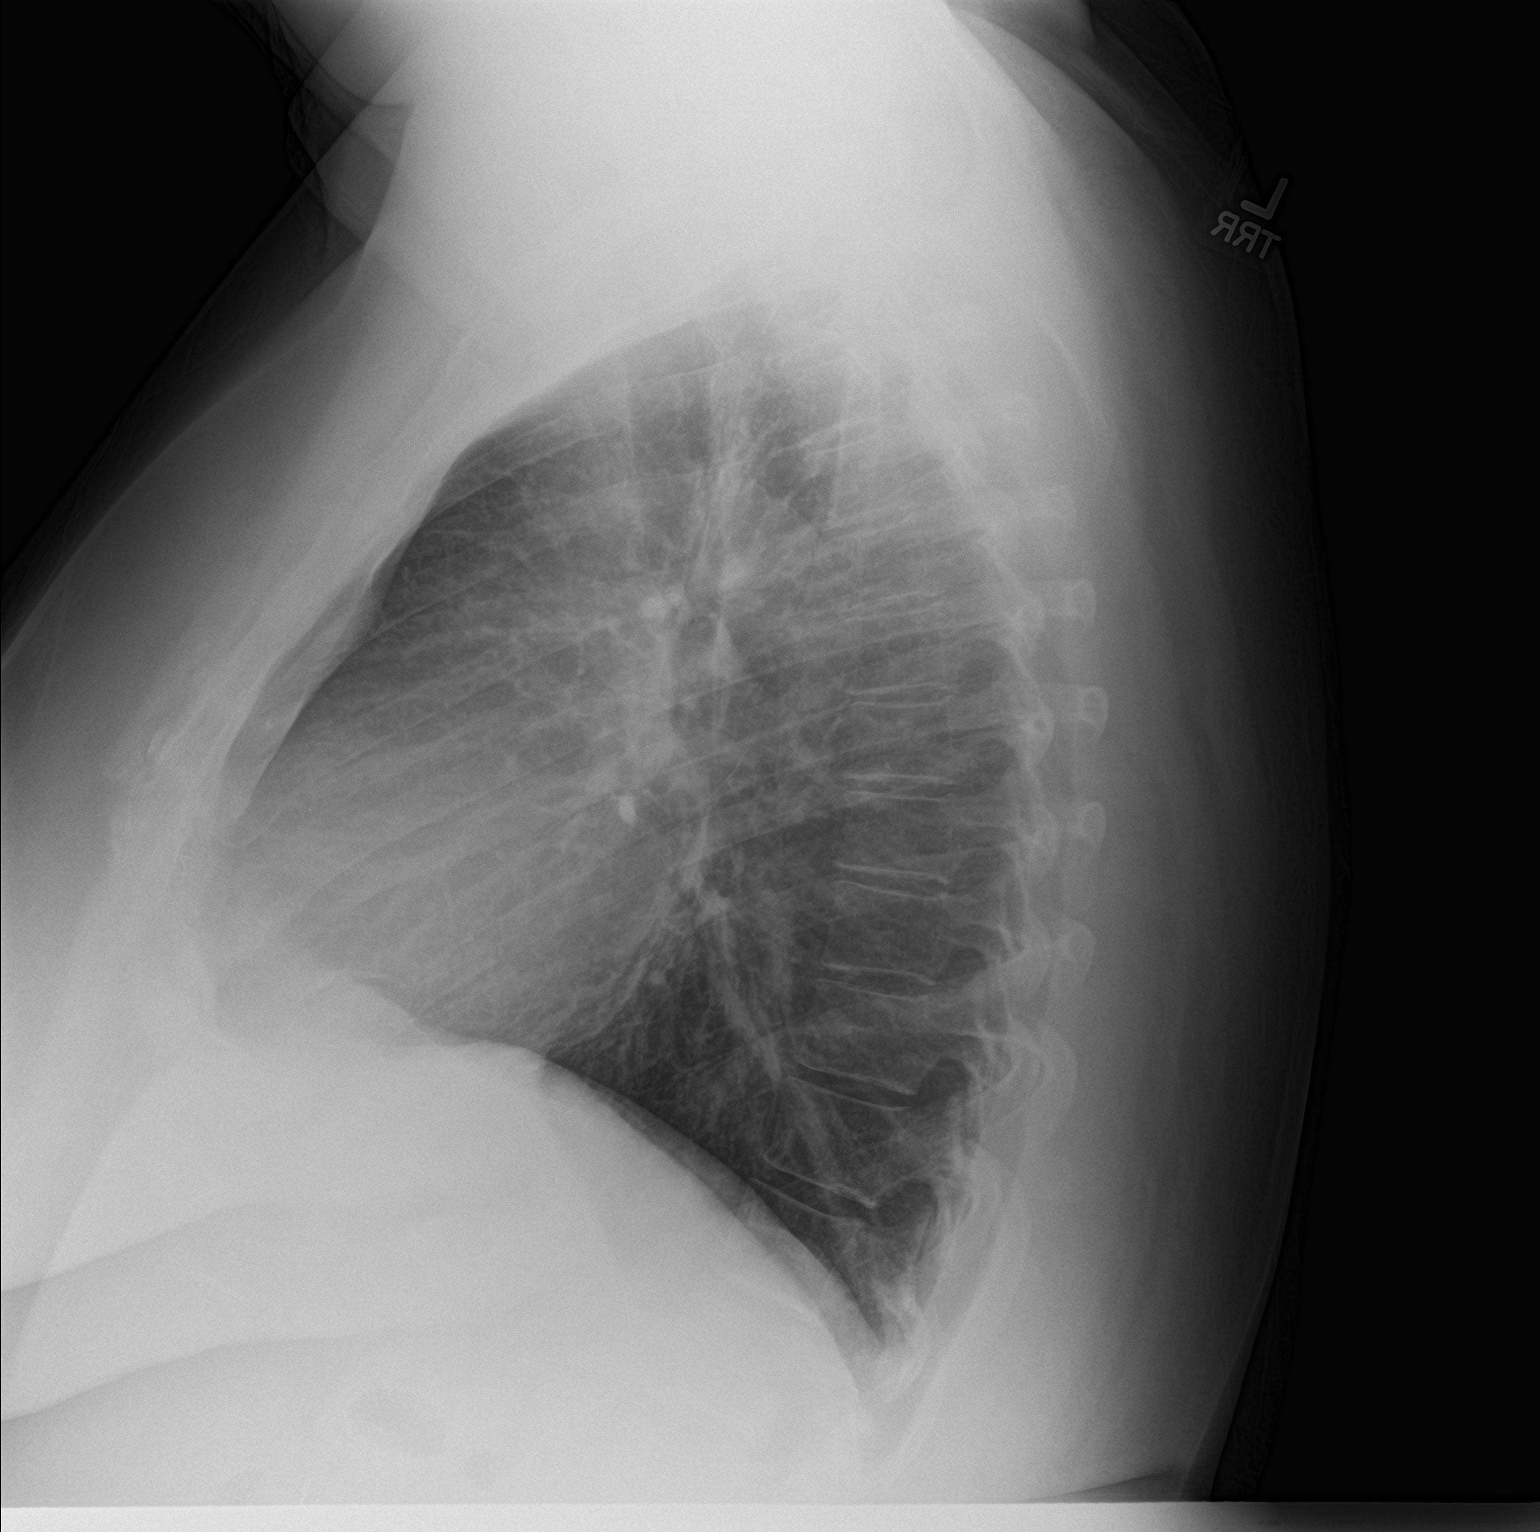

[2 of 2 positions shown; findings below may reference images not displayed]

FINDINGS: Normal mediastinum and cardiac silhouette. Normal pulmonary
vasculature. No evidence of effusion, infiltrate, or pneumothorax.
No acute bony abnormality.
IMPRESSION: No acute cardiopulmonary process.

## 2019-11-10 ENCOUNTER — Encounter: Admit: 2019-11-10 | Discharge: 2019-11-11 | Payer: PRIVATE HEALTH INSURANCE | Attending: Medical | Primary: Medical

## 2019-11-10 DIAGNOSIS — Z7689 Persons encountering health services in other specified circumstances: Principal | ICD-10-CM

## 2019-11-10 DIAGNOSIS — G8929 Other chronic pain: Principal | ICD-10-CM

## 2019-11-10 DIAGNOSIS — R609 Edema, unspecified: Principal | ICD-10-CM

## 2019-11-10 DIAGNOSIS — R0602 Shortness of breath: Principal | ICD-10-CM

## 2019-11-10 DIAGNOSIS — Z1159 Encounter for screening for other viral diseases: Principal | ICD-10-CM

## 2019-11-10 DIAGNOSIS — Z131 Encounter for screening for diabetes mellitus: Principal | ICD-10-CM

## 2019-11-10 DIAGNOSIS — M545 Low back pain: Secondary | ICD-10-CM

## 2019-11-10 DIAGNOSIS — Z13220 Encounter for screening for lipoid disorders: Principal | ICD-10-CM

## 2019-11-10 DIAGNOSIS — Z23 Encounter for immunization: Principal | ICD-10-CM

## 2019-11-10 DIAGNOSIS — R5383 Other fatigue: Principal | ICD-10-CM

## 2019-11-17 ENCOUNTER — Other Ambulatory Visit: Admit: 2019-11-17 | Discharge: 2019-11-18 | Payer: PRIVATE HEALTH INSURANCE | Attending: Medical | Primary: Medical

## 2019-11-17 DIAGNOSIS — R7989 Other specified abnormal findings of blood chemistry: Principal | ICD-10-CM

## 2019-11-18 DIAGNOSIS — E0590 Thyrotoxicosis, unspecified without thyrotoxic crisis or storm: Principal | ICD-10-CM

## 2019-11-19 DIAGNOSIS — E559 Vitamin D deficiency, unspecified: Principal | ICD-10-CM

## 2019-11-19 MED ORDER — ERGOCALCIFEROL (VITAMIN D2) 1,250 MCG (50,000 UNIT) CAPSULE
ORAL_CAPSULE | ORAL | 0 refills | 56 days | Status: CP
Start: 2019-11-19 — End: 2020-11-18

## 2019-11-27 ENCOUNTER — Encounter
Admit: 2019-11-27 | Discharge: 2019-11-28 | Payer: PRIVATE HEALTH INSURANCE | Attending: Family Medicine | Primary: Family Medicine

## 2019-11-27 DIAGNOSIS — E559 Vitamin D deficiency, unspecified: Principal | ICD-10-CM

## 2019-11-27 DIAGNOSIS — E0800 Diabetes mellitus due to underlying condition with hyperosmolarity without nonketotic hyperglycemic-hyperosmolar coma (NKHHC): Principal | ICD-10-CM

## 2019-11-27 DIAGNOSIS — R7989 Other specified abnormal findings of blood chemistry: Principal | ICD-10-CM

## 2019-11-27 DIAGNOSIS — M5440 Lumbago with sciatica, unspecified side: Principal | ICD-10-CM

## 2019-11-27 MED ORDER — ERGOCALCIFEROL (VITAMIN D2) 1,250 MCG (50,000 UNIT) CAPSULE
ORAL_CAPSULE | ORAL | 0 refills | 84 days | Status: CP
Start: 2019-11-27 — End: 2020-11-26

## 2019-12-15 ENCOUNTER — Encounter: Admit: 2019-12-15 | Discharge: 2019-12-16 | Payer: PRIVATE HEALTH INSURANCE | Attending: Medical | Primary: Medical

## 2019-12-15 DIAGNOSIS — M545 Low back pain: Secondary | ICD-10-CM

## 2019-12-15 DIAGNOSIS — G8929 Other chronic pain: Principal | ICD-10-CM

## 2019-12-15 DIAGNOSIS — R7989 Other specified abnormal findings of blood chemistry: Principal | ICD-10-CM

## 2019-12-15 DIAGNOSIS — R5383 Other fatigue: Principal | ICD-10-CM

## 2019-12-15 DIAGNOSIS — G4733 Obstructive sleep apnea (adult) (pediatric): Principal | ICD-10-CM

## 2019-12-15 DIAGNOSIS — R609 Edema, unspecified: Principal | ICD-10-CM

## 2019-12-15 DIAGNOSIS — E119 Type 2 diabetes mellitus without complications: Principal | ICD-10-CM

## 2019-12-15 DIAGNOSIS — M5440 Lumbago with sciatica, unspecified side: Principal | ICD-10-CM

## 2019-12-15 MED ORDER — ERGOCALCIFEROL (VITAMIN D2) 1,250 MCG (50,000 UNIT) CAPSULE: 50000 [IU] | capsule | 0 refills | 84 days | Status: AC

## 2019-12-15 MED ORDER — NAPROXEN 500 MG TABLET
ORAL_TABLET | Freq: Two times a day (BID) | ORAL | 1 refills | 30 days | Status: CP | PRN
Start: 2019-12-15 — End: 2020-12-14

## 2019-12-15 MED ORDER — ALBUTEROL SULFATE HFA 90 MCG/ACTUATION AEROSOL INHALER
Freq: Four times a day (QID) | RESPIRATORY_TRACT | 1 refills | 0.00000 days | Status: CP | PRN
Start: 2019-12-15 — End: ?

## 2019-12-15 MED ORDER — ERGOCALCIFEROL (VITAMIN D2) 1,250 MCG (50,000 UNIT) CAPSULE
ORAL_CAPSULE | ORAL | 0 refills | 84.00000 days | Status: CP
Start: 2019-12-15 — End: 2020-12-14

## 2019-12-15 MED ORDER — METFORMIN ER 500 MG TABLET,EXTENDED RELEASE 24 HR
ORAL_TABLET | ORAL | 1 refills | 94 days | Status: CP
Start: 2019-12-15 — End: 2020-03-30

## 2019-12-15 MED ORDER — CYCLOBENZAPRINE 10 MG TABLET
ORAL_TABLET | Freq: Two times a day (BID) | ORAL | 1 refills | 30 days | Status: CP | PRN
Start: 2019-12-15 — End: ?

## 2019-12-15 MED ORDER — HYDROCHLOROTHIAZIDE 12.5 MG CAPSULE
ORAL_CAPSULE | Freq: Every morning | ORAL | 11 refills | 30 days | Status: CP
Start: 2019-12-15 — End: 2020-12-14

## 2019-12-22 ENCOUNTER — Encounter: Admit: 2019-12-22 | Discharge: 2019-12-23 | Payer: PRIVATE HEALTH INSURANCE

## 2019-12-26 ENCOUNTER — Encounter
Admit: 2019-12-26 | Discharge: 2019-12-27 | Payer: PRIVATE HEALTH INSURANCE | Attending: Internal Medicine | Primary: Internal Medicine

## 2020-01-02 MED ORDER — METHIMAZOLE 5 MG TABLET
ORAL_TABLET | Freq: Every day | ORAL | 11 refills | 45 days | Status: CP
Start: 2020-01-02 — End: 2021-01-01

## 2020-02-03 ENCOUNTER — Encounter
Admit: 2020-02-03 | Discharge: 2020-02-04 | Payer: PRIVATE HEALTH INSURANCE | Attending: Psychiatry | Primary: Psychiatry

## 2020-02-03 DIAGNOSIS — G4733 Obstructive sleep apnea (adult) (pediatric): Principal | ICD-10-CM

## 2020-02-03 MED ORDER — FLUTICASONE PROPIONATE 50 MCG/ACTUATION NASAL SPRAY,SUSPENSION
Freq: Every day | NASAL | 0 refills | 0 days | Status: CP
Start: 2020-02-03 — End: 2021-02-02

## 2020-02-05 ENCOUNTER — Encounter: Admit: 2020-02-05 | Discharge: 2020-02-05 | Payer: PRIVATE HEALTH INSURANCE

## 2020-02-05 DIAGNOSIS — M47816 Spondylosis without myelopathy or radiculopathy, lumbar region: Principal | ICD-10-CM

## 2020-02-05 DIAGNOSIS — G8929 Other chronic pain: Principal | ICD-10-CM

## 2020-02-05 DIAGNOSIS — M545 Low back pain: Principal | ICD-10-CM

## 2020-03-06 ENCOUNTER — Encounter: Admit: 2020-03-06 | Discharge: 2020-03-07 | Payer: PRIVATE HEALTH INSURANCE | Attending: Family | Primary: Family

## 2020-03-08 ENCOUNTER — Ambulatory Visit: Admit: 2020-03-08 | Discharge: 2020-03-10 | Payer: PRIVATE HEALTH INSURANCE

## 2020-04-01 ENCOUNTER — Ambulatory Visit: Admit: 2020-04-01 | Discharge: 2020-04-01 | Payer: PRIVATE HEALTH INSURANCE | Attending: Medical | Primary: Medical

## 2020-04-01 DIAGNOSIS — M545 Low back pain: Principal | ICD-10-CM

## 2020-04-01 DIAGNOSIS — E0500 Thyrotoxicosis with diffuse goiter without thyrotoxic crisis or storm: Principal | ICD-10-CM

## 2020-04-01 DIAGNOSIS — E119 Type 2 diabetes mellitus without complications: Principal | ICD-10-CM

## 2020-04-01 DIAGNOSIS — R609 Edema, unspecified: Principal | ICD-10-CM

## 2020-04-01 DIAGNOSIS — F331 Major depressive disorder, recurrent, moderate: Principal | ICD-10-CM

## 2020-04-01 DIAGNOSIS — G4733 Obstructive sleep apnea (adult) (pediatric): Principal | ICD-10-CM

## 2020-04-01 DIAGNOSIS — G8929 Other chronic pain: Principal | ICD-10-CM

## 2020-04-01 DIAGNOSIS — Z113 Encounter for screening for infections with a predominantly sexual mode of transmission: Principal | ICD-10-CM

## 2020-04-01 DIAGNOSIS — Z124 Encounter for screening for malignant neoplasm of cervix: Principal | ICD-10-CM

## 2020-04-01 DIAGNOSIS — Z0000 Encounter for general adult medical examination without abnormal findings: Principal | ICD-10-CM

## 2020-04-01 DIAGNOSIS — L732 Hidradenitis suppurativa: Principal | ICD-10-CM

## 2020-04-01 DIAGNOSIS — E559 Vitamin D deficiency, unspecified: Principal | ICD-10-CM

## 2020-04-01 MED ORDER — ERGOCALCIFEROL (VITAMIN D2) 1,250 MCG (50,000 UNIT) CAPSULE
ORAL_CAPSULE | ORAL | 0 refills | 84.00000 days | Status: CP
Start: 2020-04-01 — End: 2021-04-01

## 2020-04-01 MED ORDER — BUPROPION HCL XL 150 MG 24 HR TABLET, EXTENDED RELEASE
ORAL_TABLET | Freq: Every morning | ORAL | 2 refills | 30 days | Status: CP
Start: 2020-04-01 — End: 2021-04-01

## 2020-04-01 MED ORDER — METHOCARBAMOL 750 MG TABLET
ORAL_TABLET | Freq: Three times a day (TID) | ORAL | 1 refills | 20.00000 days | Status: CP | PRN
Start: 2020-04-01 — End: 2020-05-01

## 2020-04-02 ENCOUNTER — Encounter: Admit: 2020-04-02 | Discharge: 2020-04-03 | Payer: PRIVATE HEALTH INSURANCE

## 2020-05-13 ENCOUNTER — Ambulatory Visit: Admit: 2020-05-13 | Discharge: 2020-05-14 | Payer: PRIVATE HEALTH INSURANCE | Attending: Medical | Primary: Medical

## 2020-05-13 DIAGNOSIS — E0500 Thyrotoxicosis with diffuse goiter without thyrotoxic crisis or storm: Principal | ICD-10-CM

## 2020-05-13 DIAGNOSIS — Z720 Tobacco use: Principal | ICD-10-CM

## 2020-05-13 DIAGNOSIS — F331 Major depressive disorder, recurrent, moderate: Principal | ICD-10-CM

## 2020-05-13 DIAGNOSIS — L732 Hidradenitis suppurativa: Principal | ICD-10-CM

## 2020-05-13 MED ORDER — METOPROLOL SUCCINATE ER 25 MG TABLET,EXTENDED RELEASE 24 HR
ORAL_TABLET | Freq: Every day | ORAL | 3 refills | 90.00000 days | Status: CP
Start: 2020-05-13 — End: 2021-05-13

## 2020-05-13 MED ORDER — VARENICLINE 0.5 MG (11)-1 MG (42) TABLETS IN A DOSE PACK
ORAL | 0 refills | 0.00000 days | Status: CP
Start: 2020-05-13 — End: 2020-08-11

## 2020-05-13 MED ORDER — VARENICLINE 1 MG TABLET
ORAL_TABLET | Freq: Two times a day (BID) | ORAL | 2 refills | 30.00000 days | Status: CP
Start: 2020-05-13 — End: 2020-08-11

## 2020-05-13 MED ORDER — DOXYCYCLINE HYCLATE 100 MG TABLET
ORAL_TABLET | Freq: Two times a day (BID) | ORAL | 0 refills | 14.00000 days | Status: CP
Start: 2020-05-13 — End: 2020-05-27

## 2020-07-13 ENCOUNTER — Encounter: Admit: 2020-07-13 | Payer: PRIVATE HEALTH INSURANCE | Attending: Medical | Primary: Medical

## 2020-07-13 ENCOUNTER — Encounter: Admit: 2020-07-13 | Payer: PRIVATE HEALTH INSURANCE | Attending: Neurology | Primary: Neurology

## 2020-07-20 ENCOUNTER — Ambulatory Visit: Admit: 2020-07-20 | Discharge: 2020-07-21 | Payer: PRIVATE HEALTH INSURANCE

## 2020-07-20 DIAGNOSIS — N926 Irregular menstruation, unspecified: Principal | ICD-10-CM

## 2020-07-20 DIAGNOSIS — E668 Other obesity: Principal | ICD-10-CM

## 2020-07-20 DIAGNOSIS — L680 Hirsutism: Principal | ICD-10-CM

## 2020-07-20 DIAGNOSIS — E0590 Thyrotoxicosis, unspecified without thyrotoxic crisis or storm: Principal | ICD-10-CM

## 2020-08-04 ENCOUNTER — Ambulatory Visit
Admit: 2020-08-04 | Discharge: 2020-08-05 | Payer: PRIVATE HEALTH INSURANCE | Attending: Dermatology | Primary: Dermatology

## 2020-08-04 DIAGNOSIS — T304 Corrosion of unspecified body region, unspecified degree: Principal | ICD-10-CM

## 2020-08-04 DIAGNOSIS — L700 Acne vulgaris: Principal | ICD-10-CM

## 2020-08-04 DIAGNOSIS — Z79899 Other long term (current) drug therapy: Principal | ICD-10-CM

## 2020-08-04 DIAGNOSIS — L732 Hidradenitis suppurativa: Principal | ICD-10-CM

## 2020-08-04 MED ORDER — CLINDAMYCIN PHOSPHATE 1 % TOPICAL SWAB
Freq: Two times a day (BID) | TOPICAL | 11 refills | 0.00000 days | Status: CP
Start: 2020-08-04 — End: ?

## 2020-08-04 MED ORDER — SPIRONOLACTONE 100 MG TABLET
ORAL_TABLET | Freq: Every day | ORAL | 11 refills | 30.00000 days | Status: CP
Start: 2020-08-04 — End: 2021-08-04

## 2020-08-04 MED ORDER — CLINDAMYCIN HCL 300 MG CAPSULE
ORAL_CAPSULE | Freq: Two times a day (BID) | ORAL | 2 refills | 30.00000 days | Status: CP
Start: 2020-08-04 — End: 2020-08-04

## 2020-08-04 MED ORDER — CEFDINIR 300 MG CAPSULE
ORAL_CAPSULE | Freq: Two times a day (BID) | ORAL | 2 refills | 30.00000 days | Status: CP
Start: 2020-08-04 — End: ?

## 2020-08-10 DIAGNOSIS — L732 Hidradenitis suppurativa: Principal | ICD-10-CM

## 2020-08-10 MED ORDER — HUMIRA PEN CITRATE FREE STARTER PACK FOR CROHN'S/UC/HS 3 X 80 MG/0.8 ML
PACK | ORAL | 0 refills | 0.00000 days | Status: CP
Start: 2020-08-10 — End: ?

## 2020-08-10 MED ORDER — HUMIRA PEN CITRATE FREE 40 MG/0.4 ML
PACK | SUBCUTANEOUS | 2 refills | 14.00000 days | Status: CP
Start: 2020-08-10 — End: ?
  Filled 2020-09-02: qty 3, 28d supply, fill #0

## 2020-08-12 DIAGNOSIS — L732 Hidradenitis suppurativa: Principal | ICD-10-CM

## 2020-08-23 NOTE — Unmapped (Signed)
[facility] Specialty Medication Onboarding    Specialty Medication: Humira pens starter kit and maintenance dose of 40mg  every week  Prior Authorization: Approved   Financial Assistance: No - copay  <$25  Final Copay/Day Supply: $3 each / 28 days each    Insurance Restrictions: Yes - max 1 month supply     Notes to Pharmacist:     The triage team has completed the benefits investigation and has determined that the patient is able to fill this medication at [facility]. Please contact the patient to complete the onboarding or follow up with the prescribing physician as needed.

## 2020-08-23 NOTE — Unmapped (Unsigned)
[facility]   Patient Onboarding/Medication Counseling    Kelly May is a 39 y.o. female with Hidradenitis Suppurativa who I am counseling today on initiation of therapy.  I am speaking to the patient.    Was a Nurse, learning disability used for this call? No    Verified patient's date of birth / HIPAA.    Specialty medication(s) to be sent: Inflammatory Disorders: Humira      Non-specialty medications/supplies to be sent: Transport planner      Medications not needed at this time: n/a         Humira (adalimumab)    Medication & Administration     Dosage: Hidradenitis supprativa: Inject 160mg  under the skin on day 1, 80mg  on day 15, then 40mg  every 7 days starting on day 29    Lab tests required prior to treatment initiation:  ??? Tuberculosis: Tuberculosis screening resulted in a non-reactive Quantiferon TB Gold assay.  ??? Hepatitis B: Hepatitis B serology studies are complete and non-reactive.    Administration:     Prefilled auto-injector pen  1. Gather all supplies needed for injection on a clean, flat working surface: medication pen removed from packaging, alcohol swab, sharps container, etc.  2. Look at the medication label - look for correct medication, correct dose, and check the expiration date  3. Look at the medication - the liquid visible in the window on the side of the pen device should appear clear and colorless  4. Lay the auto-injector pen on a flat surface and allow it to warm up to room temperature for at least 30-45 minutes  5. Select injection site - you can use the front of your thigh or your belly (but not the area 2 inches around your belly button); if someone else is giving you the injection you can also use your upper arm in the skin covering your triceps muscle  6. Prepare injection site - wash your hands and clean the skin at the injection site with an alcohol swab and let it air dry, do not touch the injection site again before the injection  7. Pull the 2 safety caps straight off - gray/white to uncover the needle cover and the plum cap to uncover the plum activator button, do not remove until immediately prior to injection and do not touch the white needle cover  8. Gently squeeze the area of cleaned skin and hold it firmly to create a firm surface at the selected injection site  9. Put the white needle cover against your skin at the injection site at a 90 degree angle, hold the pen such that you can see the clear medication window  10. Press down and hold the pen firmly against your skin, press the plum activator button to initiate the injection, there will be a click when the injection starts  11. Continue to hold the pen firmly against your skin for about 10-15 seconds - the window will start to turn solid yellow  12. To verify the injection is complete after 10-15 seconds, look and ensure the window is solid yellow and then pull the pen away from your skin  13. Dispose of the used auto-injector pen immediately in your sharps disposal container the needle will be covered automatically  14. If you see any blood at the injection site, press a cotton ball or gauze on the site and maintain pressure until the bleeding stops, do not rub the injection site    Adherence/Missed dose instructions:  If  your injection is given more than 3 days after your scheduled injection date ??? consult your pharmacist for additional instructions on how to adjust your dosing schedule.    Goals of Therapy     - Reduce the frequency and severity of new lesions  - Minimize pain and suppuration  - Prevent disease progression and limit scarring  - Maintenance of effective psychosocial functioning    Side Effects & Monitoring Parameters     ??? Injection site reaction (redness, irritation, inflammation localized to the site of administration)  ??? Signs of a common cold ??? minor sore throat, runny or stuffy nose, etc.  ??? Upset stomach  ??? Headache    The following side effects should be reported to the provider:  ??? Signs of a hypersensitivity reaction ??? rash; hives; itching; red, swollen, blistered, or peeling skin; wheezing; tightness in the chest or throat; difficulty breathing, swallowing, or talking; swelling of the mouth, face, lips, tongue, or throat; etc.  ??? Reduced immune function ??? report signs of infection such as fever; chills; body aches; very bad sore throat; ear or sinus pain; cough; more sputum or change in color of sputum; pain with passing urine; wound that will not heal, etc.  Also at a slightly higher risk of some malignancies (mainly skin and blood cancers) due to this reduced immune function.  o In the case of signs of infection ??? the patient should hold the next dose of Humira?? and call your primary care provider to ensure adequate medical care.  Treatment may be resumed when infection is treated and patient is asymptomatic.  ??? Changes in skin ??? a new growth or lump that forms; changes in shape, size, or color of a previous mole or marking  ??? Signs of unexplained bruising or bleeding ??? throwing up blood or emesis that looks like coffee grounds; black, tarry, or bloody stool; etc.  ??? Signs of new or worsening heart failure ??? shortness of breath; sudden weight gain; heartbeat that is not normal; swelling in the arms or legs that is new or worse      Contraindications, Warnings, & Precautions     ??? Have your bloodwork checked as you have been told by your prescriber  ??? Talk with your doctor if you are pregnant, planning to become pregnant, or breastfeeding  ??? Discuss the possible need for holding your dose(s) of Humira?? when a planned procedure is scheduled with the prescriber as it may delay healing/recovery timeline       Drug/Food Interactions     ??? Medication list reviewed in Epic. The patient was instructed to inform the care team before taking any new medications or supplements. No drug interactions identified.   ??? Talk with you prescriber or pharmacist before receiving any live vaccinations while taking this medication and after you stop taking it    Storage, Handling Precautions, & Disposal     ??? Store this medication in the refrigerator.  Do not freeze  ??? If needed, you may store at room temperature for up to 14 days  ??? Store in original packaging, protected from light  ??? Do not shake  ??? Dispose of used syringes/pens in a sharps disposal container      Current Medications (including OTC/herbals), Comorbidities and Allergies     Current Outpatient Medications   Medication Sig Dispense Refill   ??? albuterol HFA 90 mcg/actuation inhaler Inhale 2 puffs every six (6) hours as needed. 1 Inhaler 1   ??? buPROPion [facility]  XL) 150 MG 24 hr tablet Take 1 tablet (150 mg total) by mouth every morning. 30 tablet 2   ??? cefdinir (OMNICEF) 300 MG capsule Take 1 capsule (300 mg total) by mouth Two (2) times a day. Take with food and full glass water. 60 capsule 2   ??? clindamycin (CLEOCIN T) 1 % Swab Apply 1 application topically Two (2) times a day. To face and armpits 60 each 11   ??? cyclobenzaprine (FLEXERIL) 10 MG tablet TAKE 1 TABLET BY MOUTH TWICE DAILY AS NEEDED FOR MUSCLE SPASM (Patient not taking: Reported on 08/03/2020)     ??? doxycycline (VIBRA-TABS) 100 MG tablet TAKE 1 TABLET BY MOUTH TWICE DAILY FOR 14 DAYS     ??? ergocalciferol-1,250 mcg, 50,000 unit, (DRISDOL) 1,250 mcg (50,000 unit) capsule Take 1 capsule (1,250 mcg total) by mouth once a week. 12 capsule 0   ??? FLUoxetine (PROZAC) 10 MG capsule TAKE 1 CAPSULE BY MOUTH IN THE MORNING     ??? fluticasone propionate (FLONASE) 50 mcg/actuation nasal spray 1 spray by Each Nare route daily. 16 g 0   ??? HUMIRA PEN CITRATE FREE 40 MG/0.4 ML Inject the contents of 1 pen (40 mg total) under the skin once a week. 4 each 2   ??? HUMIRA PEN CITRATE FREE STARTER PACK FOR CROHN'S/UC/HS 3 X 80 MG/0.8 ML Inject the contents of 2 pens (160 mg) under the skin on day 1, then 1 pen (80 mg) 2 weeks later (day 15). 3 each 0   ??? hydroCHLOROthiazide (MICROZIDE) 12.5 mg capsule Take 1 capsule (12.5 mg total) by mouth every morning. 30 capsule 11   ??? meTHIMazole (TAPAZOLE) 5 MG tablet Take 2 tablets (10 mg total) by mouth daily. 90 tablet 11   ??? methocarbamoL (ROBAXIN) 750 MG tablet Take 750 mg by mouth Three (3) times a day as needed.     ??? metoprolol succinate (TOPROL XL) 25 MG 24 hr tablet Take 1 tablet (25 mg total) by mouth daily. 90 tablet 3   ??? naproxen (NAPROSYN) 500 MG tablet Take 1 tablet (500 mg total) by mouth two (2) times a day as needed (with food). 60 tablet 1   ??? spironolactone (ALDACTONE) 100 MG tablet Take 1 tablet (100 mg total) by mouth daily. DO NOT TAKE IF PREGNANT. 30 tablet 11     No current facility-administered medications for this visit.       No Known Allergies    Patient Active Problem List   Diagnosis   ??? Obstructive sleep apnea   ??? Morbid obesity (CMS-HCC)   ??? Headache, acute   ??? Postpartum care following vaginal delivery   ??? Annual physical exam   ??? Shortness of breath   ??? Fatigue   ??? Peripheral edema   ??? Chronic low back pain without sciatica   ??? Vitamin D deficiency   ??? Type 2 diabetes mellitus without complication, without long-term current use of insulin (CMS-HCC)   ??? Acquired hypothyroidism   ??? Acute bilateral low back pain with sciatica   ??? Graves disease   ??? Routine screening for STI (sexually transmitted infection)   ??? 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 Hidradenitis suppurativa   ??? Tobacco abuse   ??? Rectal bleeding       Reviewed and up to date in Epic.    Appropriateness of Therapy     Is medication and dose appropriate based on diagnosis? Yes    Prescription has been clinically  reviewed: Yes    Baseline Quality of Life Assessment      How many days over the past month did your HS  keep you from your normal activities? For example, brushing your teeth or getting up in the morning. 0    Financial Information     Medication Assistance provided: Prior Authorization    Anticipated copay of $3 reviewed with patient. Verified delivery address.    Delivery Information     Scheduled delivery date: 09/03/20    Expected start date: 09/05/20    Medication will be delivered via UPS to the prescription address in [facility].  This shipment will not require a signature.      Explained the services we provide at [facility] and that each month we would call to set up refills.  Stressed importance of returning phone calls so that we could ensure they receive their medications in time each month.  Informed patient that we should be setting up refills 7-10 days prior to when they will run out of medication.  A pharmacist will reach out to perform a clinical assessment periodically.  Informed patient that a welcome packet and a drug information handout will be sent.      Patient verbalized understanding of the above information as well as how to contact the pharmacy at [phone] option 4 with any questions/concerns.  The pharmacy is open Monday through Friday 8:30am-4:30pm.  A pharmacist is available 24/7 via pager to answer any clinical questions they may have.    Patient Specific Needs     - Does the patient have any physical, cognitive, or cultural barriers? No    - Patient prefers to have medications discussed with  Patient     - Is the patient or caregiver able to read and understand education materials at a high school level or above? Yes    - Patient's primary language is  English     - Is the patient high risk? No    - Does the patient require a Care Management Plan? No     - Does the patient require physician intervention or other additional services (i.e. nutrition, smoking cessation, social work)? No      Cova Knieriem Vangie Bicker  [facility] Specialty Pharmacist No      Ottilie Wigglesworth Vangie Bicker  [facility] Specialty Pharmacist

## 2020-08-30 MED ORDER — EMPTY CONTAINER
2 refills | 0 days
Start: 2020-08-30 — End: ?

## 2020-09-02 MED FILL — EMPTY CONTAINER: 120 days supply | Qty: 1 | Fill #0 | Status: AC

## 2020-09-02 MED FILL — HUMIRA PEN CITRATE FREE STARTER PACK FOR CROHN'S/UC/HS 3 X 80 MG/0.8 ML: 28 days supply | Qty: 3 | Fill #0 | Status: AC

## 2020-09-02 MED FILL — EMPTY CONTAINER: 120 days supply | Qty: 1 | Fill #0

## 2020-09-09 NOTE — Unmapped (Signed)
Received denial letter from [facility] dated 08-13-2020   Unclear reasoning given that she meets all listed policy rules:   - not on another bioligc   - has been screened for TB latent infection   - has been tested for hep b sag and core ab     Roopal, have you had any issues getting humira to her?   Thanks!

## 2020-09-14 ENCOUNTER — Ambulatory Visit: Admit: 2020-09-14 | Payer: PRIVATE HEALTH INSURANCE | Attending: Internal Medicine | Primary: Internal Medicine

## 2020-09-14 NOTE — Unmapped (Unsigned)
Assessment and Plan:  #Graves disease, + TSI: Peripheral thyroid levels have not been elevated on most recent laboratory studies but these need to be repeated today.  We discussed the long-term prognosis and disease course for Graves' disease.  She was counseled on methimazole precautions and potential side effects.  We will plan to treat with antithyroid therapy until she becomes euthyroid and titrate down to the lowest dose while maintaining euthyroid state.  - TFTs today and again in 4 weeks  - continue MMI 10 mg unless otherwise directed    #Type 2 DM: Last A1c of 6.0 on Metformin 500 daily.  Continue management through primary care doctor    #Obesity: Patient has pale striae, obesity, dorsal cervical adiposity, and hirsutism.  Given the a menstrual irregularities I suspect she has PCOS however feel that ruling out Cushing's syndrome would be of value as this is part of the evaluation for PCOS.  I have ordered a DHEA-S, testosterone, and 24-hour urine cortisol.  If secondary cause for obesity and/or concerns for PCOS become present will address at follow-up.    Follow-up in 8 weeks in clinic.    The patient was seen and discussed with Dr. Maple Hudson who was available.  --  Glean Hess, MD  Endocrinology Fellow  [facility] at [facility]  Phone: [phone]   Fax: [phone]    ----------------------------------------------------------------------------------------  History of Presenting Illness:  Kelly May is a 39 y.o. female who is seen in consultation for abnormalities on thyroid function studies at the request of [address], [address], *.  Interval history:  Patient has been mostly adherent to methimazole therapy and not suffered any adverse side effects.  She has occasional palpitations as well as regular bouts with diarrhea.  Otherwise she does not have any symptoms of hyperthyroidism.    This is our first visit in clinic together.  She started Metformin since her last visit and is tolerating fine for diabetes.    Weight is mostly stable but she has had difficulties with weight loss.  She has a regular menstrual cycle sometimes with menorrhagia lasting a month.  She has developed acne in her face as well as hirsutism.    Initial history  Kelly May is a 39 year old female who is seen in consultation to evaluate abnormalities observed on thyroid function studies last month.  She reports having a history of hypothyroidism diagnosed somewhere between 2015 and 2017 while receiving care at an outside clinic.  I do not have access to these records.  She was briefly on levothyroxine for a year or 2 and subsequently was told to discontinue because thyroid function had normalized.  She has been off levothyroxine for roughly 2 years per her report.  Thyroid function studies were obtained last month as part of routine screening labs given her history of hypothyroidism.  These laboratory studies showed suppressed TSH with mildly elevated peripheral thyroid hormone levels.  Repeat labs showed similar findings several weeks later.  Symptom wise, she reports chronic but stable anxiety and heat intolerance which has been ongoing for years.  She also reports occasional rapid heart rate greater than 100 bpm with palpitations that are mild and infrequent.  She believes she has an upper extremity tremor that is intermittently present and not present today.  She denies any unintended weight loss.  No changes in voice nor globus sensation or tenderness in the throat or over the anterior neck.  There is been no recent upper respiratory illness.  She does not have any family history of thyroid disease.    She does not use biotin or hair and nail supplements.  No over-the-counter supplements such as thyroid extract or thyroid support supplements.  She has not used previously prescribed levothyroxine in over 2 years.    Patient was also diagnosed with type 2 diabetes based on an A1c of 6.5 and started on Metformin.  This is a new diagnosis and new therapy.    Patient has had obesity for most of her life and reports weight gain of 70 pounds over the last year.     A 10 system ROS was completed and is negative other than that in HPI.    Past Medical History:   Diagnosis Date   ??? Abnormal TSH 11/27/2019   ??? Acquired hypothyroidism 11/27/2019   ??? Anemia    ??? Anxiety    ??? COPD (chronic obstructive pulmonary disease) (CMS-HCC)    ??? Dysthymia    ??? Encounter for supervision of normal pregnancy in multigravida 11/21/2012    FWB: Weekly BPPs starting @ 32-34 weeks for BMI>40. EFW: 2731 gms 6 lbs 0 oz (52%) at 34 1/7.  EFW: 3739 gms 8 lbs 4 oz (83%) at 37 6/7 GBS: neg MOD: Anticipate SVD. Feeding:  PP: LARC (IUD and*   ??? Graves disease    ??? Headache(784.0)    ??? Obesity    ??? OSA (obstructive sleep apnea)    ??? Sleep apnea    ??? Substance abuse (CMS-HCC)    ??? Type 2 diabetes mellitus without complication, without long-term current use of insulin (CMS-HCC) 11/27/2019      Surgical History:  Cholecystectomy    Current Outpatient Medications:   ???  albuterol HFA 90 mcg/actuation inhaler, Inhale 2 puffs every six (6) hours as needed., Disp: 1 Inhaler, Rfl: 1  ???  buPROPion (WELLBUTRIN XL) 150 MG 24 hr tablet, Take 1 tablet (150 mg total) by mouth every morning., Disp: 30 tablet, Rfl: 2  ???  cefdinir (OMNICEF) 300 MG capsule, Take 1 capsule (300 mg total) by mouth Two (2) times a day. Take with food and full glass water., Disp: 60 capsule, Rfl: 2  ???  clindamycin (CLEOCIN T) 1 % Swab, Apply 1 application topically Two (2) times a day. To face and armpits, Disp: 60 each, Rfl: 11  ???  cyclobenzaprine (FLEXERIL) 10 MG tablet, TAKE 1 TABLET BY MOUTH TWICE DAILY AS NEEDED FOR MUSCLE SPASM (Patient not taking: Reported on 08/03/2020), Disp: , Rfl:   ???  doxycycline (VIBRA-TABS) 100 MG tablet, TAKE 1 TABLET BY MOUTH TWICE DAILY FOR 14 DAYS, Disp: , Rfl:   ???  empty container (SHARPS CONTAINER) Misc, Use as directed, Disp: 1 each, Rfl: 2  ???  ergocalciferol-1,250 mcg, 50,000 unit, (DRISDOL) 1,250 mcg (50,000 unit) capsule, Take 1 capsule (1,250 mcg total) by mouth once a week., Disp: 12 capsule, Rfl: 0  ???  FLUoxetine (PROZAC) 10 MG capsule, TAKE 1 CAPSULE BY MOUTH IN THE MORNING, Disp: , Rfl:   ???  fluticasone propionate (FLONASE) 50 mcg/actuation nasal spray, 1 spray by Each Nare route daily., Disp: 16 g, Rfl: 0  ???  HUMIRA PEN CITRATE FREE 40 MG/0.4 ML, Inject the contents of 1 pen (40 mg total) under the skin once a week., Disp: 4 each, Rfl: 2  ???  HUMIRA PEN CITRATE FREE STARTER PACK FOR CROHN'S/UC/HS 3 X 80 MG/0.8 ML, Inject the contents of 2 pens (160 mg) under the skin on day 1,  then 1 pen (80 mg) 2 weeks later (day 15)., Disp: 3 each, Rfl: 0  ???  hydroCHLOROthiazide (MICROZIDE) 12.5 mg capsule, Take 1 capsule (12.5 mg total) by mouth every morning., Disp: 30 capsule, Rfl: 11  ???  meTHIMazole (TAPAZOLE) 5 MG tablet, Take 2 tablets (10 mg total) by mouth daily., Disp: 90 tablet, Rfl: 11  ???  methocarbamoL (ROBAXIN) 750 MG tablet, Take 750 mg by mouth Three (3) times a day as needed., Disp: , Rfl:   ???  metoprolol succinate (TOPROL XL) 25 MG 24 hr tablet, Take 1 tablet (25 mg total) by mouth daily., Disp: 90 tablet, Rfl: 3  ???  naproxen (NAPROSYN) 500 MG tablet, Take 1 tablet (500 mg total) by mouth two (2) times a day as needed (with food)., Disp: 60 tablet, Rfl: 1  ???  spironolactone (ALDACTONE) 100 MG tablet, Take 1 tablet (100 mg total) by mouth daily. DO NOT TAKE IF PREGNANT., Disp: 30 tablet, Rfl: 11      Family History   Problem Relation Age of Onset   ??? Hypertension Mother    ??? Osteoporosis Mother    ??? Tremor Mother    ??? Diabetes Maternal Aunt    ??? COPD Father    ??? Asthma Brother    ??? Asthma Brother    ??? COPD Maternal Grandfather    ??? Diabetes Maternal Aunt    ??? Diabetes Maternal Uncle    ??? Diabetes Maternal Uncle    ??? Stroke Maternal Uncle    ??? Osteoporosis Maternal Grandmother    ??? Melanoma Neg Hx    ??? Squamous cell carcinoma Neg Hx    ??? Basal cell carcinoma Neg Hx       Social History:  Works from home  No etoh  Tobacco use 1/2 ppd x 23 years  Rare marijuana    Physical Exam  There were no vitals taken for this visit.  GENERAL APPEARANCE: Well developed, well nourished, alert and cooperative, and appears to be in no acute distress.  HEAD: normocephalic. Moon facies, mild pustular acne with mild hirsutism along the chin line  NEURO: No tremor appreciated, reflexes 2+ throughout  EYE: no obvious proptosis  Abdomen/GI: Soft and nontender with pale pink stretch marks dinner than the width of the finger.  Extremities: Nonpitting edema in the lower extremities bilaterally    Relevant labs and studies not mentioned above:    EXAM: US THYROID  DATE: 12/22/2019 4:19 PM  ACCESSION: [id number]  DICTATED: 12/22/2019 4:20 PM  INTERPRETATION LOCATION: [facility]  ??  CLINICAL INDICATION: 39 years old Female with hyperthyroidism  - R79.89 - Abnormal TSH   ??  COMPARISON: None available.  ??  TECHNIQUE:  Ultrasound views of the thyroid were obtained using gray scale and limited color Doppler imaging.  ??  FINDINGS:  Somewhat limited study overall due to patient body habitus.  ??  Thyroid size: see below  ??  Thyroid echotexture: Diffusely mildly heterogenous thyroid. No suspicious thyroid nodules identified. Multiple subcentimeter hypoechoic lesions in the bilateral thyroid, which does not meet TIRADS criteria for follow-up.  ??  Lymph nodes: No adenopathy  ??  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  No suspicious thyroid nodules identified.        -------------------------------------------------------------------------------------------------------------------------------

## 2020-09-22 NOTE — Unmapped (Signed)
[facility] Shared [facility] Clinical Assessment & Refill Coordination Note    Kelly May, DOB: June 16, 1981  Phone: [phone] (home)     All above HIPAA information was verified with patient.     Was a Nurse, learning disability used for this call? No    Specialty Medication(s):   Inflammatory Disorders: Humira     Current Outpatient Medications   Medication Sig Dispense Refill   ??? albuterol HFA 90 mcg/actuation inhaler Inhale 2 puffs every six (6) hours as needed. 1 Inhaler 1   ??? buPROPion (WELLBUTRIN XL) 150 MG 24 hr tablet Take 1 tablet (150 mg total) by mouth every morning. 30 tablet 2   ??? cefdinir (OMNICEF) 300 MG capsule Take 1 capsule (300 mg total) by mouth Two (2) times a day. Take with food and full glass water. 60 capsule 2   ??? clindamycin (CLEOCIN T) 1 % Swab Apply 1 application topically Two (2) times a day. To face and armpits 60 each 11   ??? cyclobenzaprine (FLEXERIL) 10 MG tablet TAKE 1 TABLET BY MOUTH TWICE DAILY AS NEEDED FOR MUSCLE SPASM (Patient not taking: Reported on 08/03/2020)     ??? doxycycline (VIBRA-TABS) 100 MG tablet TAKE 1 TABLET BY MOUTH TWICE DAILY FOR 14 DAYS     ??? empty container (SHARPS CONTAINER) Misc Use as directed 1 each 2   ??? ergocalciferol-1,250 mcg, 50,000 unit, (DRISDOL) 1,250 mcg (50,000 unit) capsule Take 1 capsule (1,250 mcg total) by mouth once a week. 12 capsule 0   ??? FLUoxetine (PROZAC) 10 MG capsule TAKE 1 CAPSULE BY MOUTH IN THE MORNING     ??? fluticasone propionate (FLONASE) 50 mcg/actuation nasal spray 1 spray by Each Nare route daily. 16 g 0   ??? HUMIRA PEN CITRATE FREE 40 MG/0.4 ML Inject the contents of 1 pen (40 mg total) under the skin once a week. 4 each 2   ??? HUMIRA PEN CITRATE FREE STARTER PACK FOR CROHN'S/UC/HS 3 X 80 MG/0.8 ML Inject the contents of 2 pens (160 mg) under the skin on day 1, then 1 pen (80 mg) 2 weeks later (day 15). 3 each 0   ??? hydroCHLOROthiazide (MICROZIDE) 12.5 mg capsule Take 1 capsule (12.5 mg total) by mouth every morning. 30 capsule 11   ??? meTHIMazole (TAPAZOLE) 5 MG tablet Take 2 tablets (10 mg total) by mouth daily. 90 tablet 11   ??? methocarbamoL (ROBAXIN) 750 MG tablet Take 750 mg by mouth Three (3) times a day as needed.     ??? metoprolol succinate (TOPROL XL) 25 MG 24 hr tablet Take 1 tablet (25 mg total) by mouth daily. 90 tablet 3   ??? naproxen (NAPROSYN) 500 MG tablet Take 1 tablet (500 mg total) by mouth two (2) times a day as needed (with food). 60 tablet 1   ??? spironolactone (ALDACTONE) 100 MG tablet Take 1 tablet (100 mg total) by mouth daily. DO NOT TAKE IF PREGNANT. 30 tablet 11     No current facility-administered medications for this visit.        Changes to medications: [name] reports no changes at this time.    No Known Allergies    Changes to allergies: No    SPECIALTY MEDICATION ADHERENCE     Humira 40/0.4 mg/ml: 9 days of medicine on hand       Medication Adherence    Patient reported X missed doses in the last month: 0  Specialty Medication: Humira 40 mg/0.4 ml  Patient is  on additional specialty medications: No  Informant: patient  Reasons for non-adherence: no problems identified  Confirmed plan for next specialty medication refill: delivery by pharmacy  Refills needed for supportive medications: not needed          Specialty medication(s) dose(s) confirmed: Regimen is correct and unchanged.     Are there any concerns with adherence? No    Adherence counseling provided? Not needed    CLINICAL MANAGEMENT AND INTERVENTION      Clinical Benefit Assessment:    Do you feel the medicine is effective or helping your condition? not yet    Clinical Benefit counseling provided? Reasonable expectations discussed: she is aware it can take 8-12 weeks for full effect    Adverse Effects Assessment:    Are you experiencing any side effects? Yes, patient reports experiencing upset stomach with the inital 2 pens (160 mg) but did not have with the second dose.. Side effect counseling provided: n/a    Are you experiencing difficulty administering your medicine? No    Quality of Life Assessment:    How many days over the past month did your HS  keep you from your normal activities? For example, brushing your teeth or getting up in the morning. 0    Have you discussed this with your provider? Not needed    Therapy Appropriateness:    Is therapy appropriate? Yes, therapy is appropriate and should be continued    DISEASE/MEDICATION-SPECIFIC INFORMATION      For patients on injectable medications: Patient currently has 0 doses left.  Next injection is scheduled for 10/01/20.    PATIENT SPECIFIC NEEDS     - Does the patient have any physical, cognitive, or cultural barriers? No    - Is the patient high risk? No    - Does the patient require a Care Management Plan? No     - Does the patient require physician intervention or other additional services (i.e. nutrition, smoking cessation, social work)? No      SHIPPING     Specialty Medication(s) to be Shipped:   Inflammatory Disorders: Humira    Other medication(s) to be shipped: No additional medications requested for fill at this time     Changes to insurance: No    Delivery Scheduled: Yes, Expected medication delivery date: 09/29/20.     Medication will be delivered via UPS to the confirmed prescription address in [facility].    The patient will receive a drug information handout for each medication shipped and additional FDA Medication Guides as required.  Verified that patient has previously received a Conservation officer, historic buildings.    All of the patient's questions and concerns have been addressed.    Yukio Bisping Vangie Bicker   [facility] Shared [facility] Specialty Pharmacist

## 2020-09-28 MED FILL — HUMIRA PEN CITRATE FREE 40 MG/0.4 ML: SUBCUTANEOUS | 28 days supply | Qty: 4 | Fill #0

## 2020-09-28 MED FILL — HUMIRA PEN CITRATE FREE 40 MG/0.4 ML: 28 days supply | Qty: 4 | Fill #0 | Status: AC

## 2020-10-19 NOTE — Unmapped (Signed)
[facility] Refill Coordination Note    Specialty Medication(s) to be Shipped:   Inflammatory Disorders: Humira    Other medication(s) to be shipped: No additional medications requested for fill at this time     Kelly May, DOB: 10/28/1981  Phone: [phone] (home)       All above HIPAA information was verified with patient.     Was a Nurse, learning disability used for this call? No    Completed refill call assessment today to schedule patient's medication shipment from the [facility] [phone]).       Specialty medication(s) and dose(s) confirmed: Regimen is correct and unchanged.   Changes to medications: [name] reports no changes at this time.  Changes to insurance: No  Questions for the pharmacist: No    Confirmed patient received Welcome Packet with first shipment. The patient will receive a drug information handout for each medication shipped and additional FDA Medication Guides as required.       DISEASE/MEDICATION-SPECIFIC INFORMATION        For patients on injectable medications: Patient currently has 1 doses left.  Next injection is scheduled for 10/22/2020.    SPECIALTY MEDICATION ADHERENCE     Medication Adherence    Patient reported X missed doses in the last month: 0  Specialty Medication: HUMIRA PEN CITRATE FREE 40 MG/0.4 ML  Patient is on additional specialty medications: No  Any gaps in refill history greater than 2 weeks in the last 3 months: no  Demonstrates understanding of importance of adherence: yes  Informant: patient  Reliability of informant: reliable  Confirmed plan for next specialty medication refill: delivery by pharmacy  Refills needed for supportive medications: not needed                      SHIPPING     Shipping address confirmed in Epic.     Delivery Scheduled: Yes, Expected medication delivery date: 10/26/2020.     Medication will be delivered via UPS to the prescription address in Epic WAM.    Dimas Scheck D Nathania Waldman   [facility] Shared [facility] Specialty Technician

## 2020-10-25 MED FILL — HUMIRA PEN CITRATE FREE 40 MG/0.4 ML: 28 days supply | Qty: 4 | Fill #1 | Status: AC

## 2020-10-25 MED FILL — HUMIRA PEN CITRATE FREE 40 MG/0.4 ML: SUBCUTANEOUS | 28 days supply | Qty: 4 | Fill #1

## 2020-10-27 ENCOUNTER — Encounter
Admit: 2020-10-27 | Discharge: 2020-10-28 | Payer: PRIVATE HEALTH INSURANCE | Attending: Dermatology | Primary: Dermatology

## 2020-10-27 DIAGNOSIS — L700 Acne vulgaris: Principal | ICD-10-CM

## 2020-10-27 DIAGNOSIS — L732 Hidradenitis suppurativa: Principal | ICD-10-CM

## 2020-10-27 DIAGNOSIS — N924 Excessive bleeding in the premenopausal period: Principal | ICD-10-CM

## 2020-10-27 MED ORDER — DAPSONE 5 % TOPICAL GEL
Freq: Every morning | TOPICAL | 6 refills | 0.00000 days | Status: CP
Start: 2020-10-27 — End: ?

## 2020-10-27 MED ORDER — HUMIRA PEN CITRATE FREE 40 MG/0.4 ML
SUBCUTANEOUS | 3 refills | 28.00000 days | Status: CP
Start: 2020-10-27 — End: ?
  Filled 2020-11-16: qty 4, 28d supply, fill #0

## 2020-10-27 NOTE — Unmapped (Signed)
N-acetylcysteine for skin picking

## 2020-10-27 NOTE — Unmapped (Signed)
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 Continue glucose control with PCP; A1c of 6%   -spironolactone (ALDACTONE) 100 MG tablet daily   - cefdinir (OMNICEF) 300 MG capsule BID   - start humira given severity of disease.    - wash with hibiclens   - Rx clindamycin wipes BID for acne and HS

## 2020-10-27 NOTE — Unmapped (Signed)
Dermatology Note     Assessment and Plan:    Hidradenitis suppurativa - hurley stage II/III - groin, axillae, thighs, torso much improved on humira 40mg  weekly   Chronic problem, stable - at patient's treatment goal    - this is a chronic illness that is expected to last greater than 1 year and possibly the life of the patient.   - hold spironolactone 100mg  daily given menstrual bleeding below  - continue humira 40mg  weekly  R/B/A reviewed including but not limited to risk infection. Encouraged covid 19 vaccine booster. Can consider increase to 80mg  weekly if flaring in future     Acne vulgaris - hormonal distribution on face, flaring   Chronic problem with exacerbation   - start topical dapsone 5% gel daily     Heavy menstrual bleeding premenopausal   - advised to follow up with PCP regarding this. Reviewed that rarely malignancy can present with menstrual bleeding and thus prompt evaluation is important. She voiced understanding and will follow up with her PCP [facility]    The patient was advised to call for an appointment should any new, changing, or symptomatic lesions develop.     Problem: 1 or more chronic illnesses with exacerbation, progression, or side effects of treatment (moderate)   Data: (Straightforward) Minimal or none   Management: Prescription drug management (moderate)       RTC: Return in about 4 months (around 02/25/2021). or sooner as needed   _________________________________________________________________      Chief Complaint     Hidradenitis     HPI     Kelly May is a 39 y.o. female who presents as a returning patient (last seen 08/04/2020) to [facility] for follow up of HS.     LV - 07/2020   Hidradenitis suppurativa with acne vulgaris - hurley stage II/III - flaring chronic problem; flaring on axillae, groin, thighs, torso   Previously failed: oral antibiotic (unsure of type) x months   - reviewed association between dysglycemia and HS. Continue glucose control with PCP; A1c of 6%   - Rx spironolactone (ALDACTONE) 100 MG tablet; Take 1 tablet (100 mg total) by mouth daily. DO NOT TAKE IF PREGNANT.  R/B/A reviewed including but not limited to teratogen.   - cefdinir (OMNICEF) 300 MG capsule; Take 1 capsule (300 mg total) by mouth Two (2) times a day. Take with food and full glass water.  R/B/A reviewed including but not limited to GI upset.   - start humira given severity of disease.  R/B/A reviewed including but not limited to risk infection.   - wash with hibiclens   - Rx clindamycin wipes BID for acne and HS    Today she notes:   - using humira 40mg  weekly - has noticed an improvement. Not getting as many bumps and pain has also improved. Less drainage. 2 months. Tolerating injections well - don't hurt at all. No infections.   - hasn't had a huge flare in a while  - wasn't able to afford spironolactone.   - site of friction is usually where she has flares   - not using any topicals at this time   - asks if she can use anything for her acne   - has always had irregular periods, but recently has been having very heavy periods with clots. Missed her follow up with PCP.     The patient denies any other new or changing lesions or areas of concern.  Pertinent Past Medical History   Acne vulgaris     Problem List        Musculoskeletal and Integument    Hidradenitis suppurativa        Past Medical History, Family History, Social History, Medication List, Allergies, and Problem List were reviewed in the rooming section of Epic.     ROS: Other than symptoms mentioned in the HPI, no fevers, chills, or other skin complaints    Physical Examination     GENERAL: Well-appearing female in no acute distress, resting comfortably.  NEURO: Alert and oriented, answers questions appropriately  SKIN (Focal Skin Exam): Per patient request, examination of bilateral inguinal thighs, axillae  was performed  - non draining sinus tracts and inflammatory nodules in bilateral axillae and inguinal thighs, improved from prior     All areas not commented on are within normal limits or unremarkable      (Approved Template 08/09/2020)

## 2020-10-29 NOTE — Unmapped (Signed)
PA initiated for Dapsone 5% gel via CMM.  Key: [id number]

## 2020-11-01 DIAGNOSIS — L700 Acne vulgaris: Principal | ICD-10-CM

## 2020-11-01 MED ORDER — AZELEX 20 % TOPICAL CREAM
Freq: Every evening | TOPICAL | 1 refills | 0.00000 days | Status: CP
Start: 2020-11-01 — End: 2021-11-01

## 2020-11-01 NOTE — Unmapped (Signed)
Fax recieved PA denied for Dapasone gel   Letter scanned into chart

## 2020-11-11 NOTE — Unmapped (Signed)
Called patient to schedule apt that was requested for abnormally intense periods

## 2020-11-15 NOTE — Unmapped (Signed)
[facility] Refill Coordination Note    Specialty Medication(s) to be Shipped:   Inflammatory Disorders: Humira    Other medication(s) to be shipped: No additional medications requested for fill at this time     Kelly May, DOB: 1981/04/24  Phone: [phone] (home)       All above HIPAA information was verified with patient.     Was a Nurse, learning disability used for this call? No    Completed refill call assessment today to schedule patient's medication shipment from the [facility] [phone]).       Specialty medication(s) and dose(s) confirmed: Regimen is correct and unchanged.   Changes to medications: Kelly May reports no changes at this time.  Changes to insurance: No  Questions for the pharmacist: No    Confirmed patient received Welcome Packet with first shipment. The patient will receive a drug information handout for each medication shipped and additional FDA Medication Guides as required.       DISEASE/MEDICATION-SPECIFIC INFORMATION        For patients on injectable medications: Patient currently has 1 doses left.  Next injection is scheduled for 11/20/2020.    SPECIALTY MEDICATION ADHERENCE     Medication Adherence    Patient reported X missed doses in the last month: 0  Specialty Medication: Humira Cf 40 mg/.4 ml  Patient is on additional specialty medications: No  Any gaps in refill history greater than 2 weeks in the last 3 months: no  Demonstrates understanding of importance of adherence: yes  Informant: patient  Reliability of informant: reliable  Confirmed plan for next specialty medication refill: delivery by pharmacy  Refills needed for supportive medications: not needed                      SHIPPING     Shipping address confirmed in Epic.     Delivery Scheduled: Yes, Expected medication delivery date: 11/17/2020.     Medication will be delivered via UPS to the prescription address in Epic WAM.    Yvette Loveless D Trestin Vences   [facility] Shared [facility] Specialty Technician

## 2020-11-16 MED FILL — HUMIRA PEN CITRATE FREE 40 MG/0.4 ML: 28 days supply | Qty: 4 | Fill #0 | Status: AC

## 2020-12-08 NOTE — Unmapped (Signed)
[facility] Refill Coordination Note    Specialty Medication(s) to be Shipped:   Inflammatory Disorders: Humira    Other medication(s) to be shipped: No additional medications requested for fill at this time     Kelly May, DOB: 1981-11-18  Phone: [phone] (home)       All above HIPAA information was verified with patient.     Was a Nurse, learning disability used for this call? No    Completed refill call assessment today to schedule patient's medication shipment from the [facility] [phone]).       Specialty medication(s) and dose(s) confirmed: Regimen is correct and unchanged.   Changes to medications: Kelly May reports no changes at this time.  Changes to insurance: No  Questions for the pharmacist: No    Confirmed patient received Welcome Packet with first shipment. The patient will receive a drug information handout for each medication shipped and additional FDA Medication Guides as required.       DISEASE/MEDICATION-SPECIFIC INFORMATION        For patients on injectable medications: Patient currently has 1 doses left.  Next injection is scheduled for 12/11/2020.    SPECIALTY MEDICATION ADHERENCE     Medication Adherence    Patient reported X missed doses in the last month: 0  Specialty Medication: Humira Cf 40 mg/.4 ml  Patient is on additional specialty medications: No  Any gaps in refill history greater than 2 weeks in the last 3 months: no  Demonstrates understanding of importance of adherence: yes  Informant: patient  Reliability of informant: reliable  Confirmed plan for next specialty medication refill: delivery by pharmacy  Refills needed for supportive medications: not needed                      SHIPPING     Shipping address confirmed in Epic.     Delivery Scheduled: Yes, Expected medication delivery date: 12/16/2020.     Medication will be delivered via UPS to the prescription address in Epic WAM.    Kelly May Kelly May   [facility] Shared [facility] Specialty Technician

## 2020-12-15 MED FILL — HUMIRA PEN CITRATE FREE 40 MG/0.4 ML: SUBCUTANEOUS | 28 days supply | Qty: 4 | Fill #1

## 2021-01-06 NOTE — Unmapped (Signed)
[facility] Special[facility] Pharmacy Refill Coordination Note    Specialty Medication(s) to be Shipped:   Inflammatory Disorders: Humira    Other medication(s) to be shipped: No additional medications requested for fill at this time     Kelly May, DOB: Apr 10, 1981  Phone: [phone] (home)       All above HIPAA information was verified with patient.     Was a Nurse, learning disability used for this call? No    Completed refill call assessment today to schedule patient's medication shipment from the [facility] [phone]).       Specialty medication(s) and dose(s) confirmed: Regimen is correct and unchanged.   Changes to medications: Kelly May reports no changes at this time.  Changes to insurance: No  Questions for the pharmacist: No    Confirmed patient received Welcome Packet with first shipment. The patient will receive a drug information handout for each medication shipped and additional FDA Medication Guides as required.       DISEASE/MEDICATION-SPECIFIC INFORMATION        For patients on injectable medications: Patient currently has 2 doses left.  Next injection is scheduled for 01/08/2021.    SPECIALTY MEDICATION ADHERENCE     Medication Adherence    Patient reported X missed doses in the last month: 0  Specialty Medication: Humira Cf 40 mg/.4 ml   Patient is on additional specialty medications: No  Any gaps in refill history greater than 2 weeks in the last 3 months: no  Demonstrates understanding of importance of adherence: yes  Informant: patient  Reliability of informant: reliable  Confirmed plan for next specialty medication refill: delivery by pharmacy  Refills needed for supportive medications: not needed                      SHIPPING     Shipping address confirmed in Epic.     Delivery Scheduled: Yes, Expected medication delivery date: 01/13/2021.     Medication will be delivered via UPS to the prescription address in Epic WAM.    Christel Bai D Rahmon Heigl   [facility] Shared [facility] Specialty Technician

## 2021-01-12 MED FILL — HUMIRA PEN CITRATE FREE 40 MG/0.4 ML: SUBCUTANEOUS | 28 days supply | Qty: 4 | Fill #2

## 2021-02-02 NOTE — Unmapped (Signed)
[facility] Specialty Pharmacy Refill Coordination Note    Specialty Medication(s) to be Shipped:   Inflammatory Disorders: Humira    Other medication(s) to be shipped: No additional medications requested for fill at this time     Kelly May, DOB: 1981/05/31  Phone: [phone] (home)       All above HIPAA information was verified with patient.     Was a Nurse, learning disability used for this call? No    Completed refill call assessment today to schedule patient's medication shipment from the [facility] [phone]).       Specialty medication(s) and dose(s) confirmed: Regimen is correct and unchanged.   Changes to medications: Jaydalynn reports no changes at this time.  Changes to insurance: No  Questions for the pharmacist: No    Confirmed patient received Welcome Packet with first shipment. The patient will receive a drug information handout for each medication shipped and additional FDA Medication Guides as required.       DISEASE/MEDICATION-SPECIFIC INFORMATION        For patients on injectable medications: Patient currently has 2 doses left.  Next injection is scheduled for 02/05/2021.    SPECIALTY MEDICATION ADHERENCE     Medication Adherence    Patient reported X missed doses in the last month: 0  Specialty Medication: Humira Cf 40 mg/.4 ml   Patient is on additional specialty medications: No  Any gaps in refill history greater than 2 weeks in the last 3 months: no  Demonstrates understanding of importance of adherence: yes  Informant: patient  Reliability of informant: reliable  Confirmed plan for next specialty medication refill: delivery by pharmacy  Refills needed for supportive medications: not needed                      SHIPPING     Shipping address confirmed in Epic.     Delivery Scheduled: Yes, Expected medication delivery date: 02/10/2021.     Medication will be delivered via UPS to the prescription address in Epic WAM.    Klee Kolek D Yosiah Jasmin   [facility] Shared [facility] Specialty Technician

## 2021-02-03 DIAGNOSIS — M5440 Lumbago with sciatica, unspecified side: Principal | ICD-10-CM

## 2021-02-03 DIAGNOSIS — E119 Type 2 diabetes mellitus without complications: Principal | ICD-10-CM

## 2021-02-03 DIAGNOSIS — E559 Vitamin D deficiency, unspecified: Principal | ICD-10-CM

## 2021-02-03 MED ORDER — METHOCARBAMOL 750 MG TABLET
ORAL_TABLET | ORAL | 0 refills | 0.00000 days | Status: CP
Start: 2021-02-03 — End: ?

## 2021-02-03 MED ORDER — PROAIR HFA 90 MCG/ACTUATION AEROSOL INHALER
0 refills | 0.00000 days | Status: CP
Start: 2021-02-03 — End: ?

## 2021-02-03 MED ORDER — NAPROXEN 500 MG TABLET
ORAL_TABLET | 0 refills | 0 days | Status: CP
Start: 2021-02-03 — End: ?

## 2021-02-03 MED ORDER — ERGOCALCIFEROL (VITAMIN D2) 1,250 MCG (50,000 UNIT) CAPSULE
ORAL_CAPSULE | 0 refills | 0.00000 days
Start: 2021-02-03 — End: ?

## 2021-02-03 MED ORDER — METFORMIN ER 500 MG TABLET,EXTENDED RELEASE 24 HR
ORAL_TABLET | Freq: Two times a day (BID) | ORAL | 3 refills | 90.00000 days | Status: CP
Start: 2021-02-03 — End: ?

## 2021-02-07 MED ORDER — METHIMAZOLE 5 MG TABLET
ORAL_TABLET | Freq: Three times a day (TID) | ORAL | 0 refills | 30 days
Start: 2021-02-07 — End: 2022-02-07

## 2021-02-08 MED ORDER — METHIMAZOLE 5 MG TABLET
ORAL_TABLET | Freq: Every day | ORAL | 0 refills | 30.00000 days | Status: CP
Start: 2021-02-08 — End: 2022-02-08

## 2021-02-09 MED ORDER — FLUTICASONE PROPIONATE 50 MCG/ACTUATION NASAL SPRAY,SUSPENSION
Freq: Every day | NASAL | 0 refills | 0 days | Status: CP
Start: 2021-02-09 — End: 2022-02-09

## 2021-02-09 MED FILL — HUMIRA PEN CITRATE FREE 40 MG/0.4 ML: SUBCUTANEOUS | 28 days supply | Qty: 4 | Fill #3

## 2021-02-09 NOTE — Unmapped (Signed)
REFILL REQUEST [facility]    Fluticasone    Last Visit Date: 02/03/2020  Next Visit Date: No future appointment scheduled    Last seen Pervaiz  Lab Results   Component Value Date    Hep B Surface Ag Nonreactive 08/04/2020    Hepatitis B Surface Ag Negative 08/23/2012    Hep B S Ab Nonreactive 08/04/2020    Hep B Surf Ab Quant <8.00 08/04/2020    Hep B Core Total Ab Nonreactive 08/04/2020    Hepatitis C Ab Nonreactive 08/04/2020    HIV Antigen/Antibody Combo Nonreactive 04/01/2020    HIV Antigen/Antibody Combo Negative 08/23/2012        No results found for this or any previous visit.      No results found for this or any previous visit.      No results found for this or any previous visit.

## 2021-03-02 DIAGNOSIS — L732 Hidradenitis suppurativa: Principal | ICD-10-CM

## 2021-03-02 MED ORDER — HUMIRA PEN CITRATE FREE 40 MG/0.4 ML
SUBCUTANEOUS | 3 refills | 28 days
Start: 2021-03-02 — End: ?

## 2021-03-03 MED ORDER — HUMIRA PEN CITRATE FREE 40 MG/0.4 ML
SUBCUTANEOUS | 3 refills | 28.00000 days | Status: CP
Start: 2021-03-03 — End: ?
  Filled 2021-03-21: qty 4, 28d supply, fill #0

## 2021-03-21 NOTE — Unmapped (Signed)
Kelly May reports she's had more HS flares recently - I encouraged her to make a f/u appt.    She missed this past weekend's dose - the pharmacy hadn't been able to connect with her to schedule a delivery. I advised she could take dose tomorrow, on 4/26, then resume normal dosing this weekend.    [facility] Clinical Assessment & Refill Coordination Note    Kelly May, DOB: [birth year]  Phone: [phone] (home)     All above HIPAA information was verified with patient.     Was a Nurse, learning disability used for this call? No    Specialty Medication(s):   Inflammatory Disorders: Humira     Current Outpatient Medications   Medication Sig Dispense Refill   ??? azelaic acid (AZELEX) 20 % cream Apply topically nightly. Apply twice daily 30 g 1   ??? buPROPion (WELLBUTRIN XL) 150 MG 24 hr tablet Take 1 tablet (150 mg total) by mouth every morning. 30 tablet 2   ??? cefdinir (OMNICEF) 300 MG capsule Take 1 capsule (300 mg total) by mouth Two (2) times a day. Take with food and full glass water. 60 capsule 2   ??? clindamycin (CLEOCIN T) 1 % Swab Apply 1 application topically Two (2) times a day. To face and armpits 60 each 11   ??? cyclobenzaprine (FLEXERIL) 10 MG tablet TAKE 1 TABLET BY MOUTH TWICE DAILY AS NEEDED FOR MUSCLE SPASM (Patient not taking: Reported on 08/03/2020)     ??? dapsone (ACZONE) 5 % gel Apply 1 application topically every morning. To acne bumps 60 g 6   ??? doxycycline (VIBRA-TABS) 100 MG tablet TAKE 1 TABLET BY MOUTH TWICE DAILY FOR 14 DAYS     ??? empty container (SHARPS CONTAINER) Misc Use as directed 1 each 2   ??? ergocalciferol-1,250 mcg, 50,000 unit, (DRISDOL) 1,250 mcg (50,000 unit) capsule Take 1 capsule (1,250 mcg total) by mouth once a week. 12 capsule 0   ??? FLUoxetine (PROZAC) 10 MG capsule TAKE 1 CAPSULE BY MOUTH IN THE MORNING     ??? fluticasone propionate (FLONASE) 50 mcg/actuation nasal spray 1 spray into each nostril daily. 16 g 0   ??? HUMIRA PEN CITRATE FREE 40 MG/0.4 ML Inject the contents of 1 pen (40 mg total) under the skin once a week. 4 each 3   ??? HUMIRA PEN CITRATE FREE STARTER PACK FOR CROHN'S/UC/HS 3 X 80 MG/0.8 ML Inject the contents of 2 pens (160 mg) under the skin on day 1, then 1 pen (80 mg) 2 weeks later (day 15). 3 each 0   ??? hydroCHLOROthiazide (MICROZIDE) 12.5 mg capsule Take 1 capsule (12.5 mg total) by mouth every morning. 30 capsule 11   ??? metFORMIN (GLUCOPHAGE-XR) 500 MG 24 hr tablet Take 1 tablet (500 mg total) by mouth 2 (two) times a day with meals. 180 tablet 3   ??? meTHIMazole (TAPAZOLE) 5 MG tablet Take 2 tablets (10 mg total) by mouth daily. 60 tablet 0   ??? methocarbamoL (ROBAXIN) 750 MG tablet Take 1 tablet by mouth three times daily as needed 60 tablet 0   ??? metoprolol succinate (TOPROL XL) 25 MG 24 hr tablet Take 1 tablet (25 mg total) by mouth daily. 90 tablet 3   ??? naproxen (NAPROSYN) 500 MG tablet TAKE 1 TABLET BY MOUTH TWICE DAILY AS NEEDED WITH FOOD 60 tablet 0   ??? PROAIR HFA 90 mcg/actuation inhaler INHALE 2 PUFFS BY MOUTH EVERY 6 HOURS AS NEEDED 9 g  0   ??? spironolactone (ALDACTONE) 100 MG tablet Take 1 tablet (100 mg total) by mouth daily. DO NOT TAKE IF PREGNANT. 30 tablet 11     No current facility-administered medications for this visit.        Changes to medications: [name] reports no changes at this time.    No Known Allergies    Changes to allergies: No    SPECIALTY MEDICATION ADHERENCE     Humira 40/0.4 mg/ml: 0 left    Medication Adherence    Patient reported X missed doses in the last month: 0  Specialty Medication: Humira  Patient is on additional specialty medications: No     Specialty medication(s) dose(s) confirmed: Regimen is correct and unchanged.     Are there any concerns with adherence? No    Adherence counseling provided? Not needed    CLINICAL MANAGEMENT AND INTERVENTION      Clinical Benefit Assessment:    Do you feel the medicine is effective or helping your condition? yes, but less so recently. has had more flares.     Clinical Benefit counseling provided? encouraged f/u visit - per clinic notes, was due for f/u on 4/1    Adverse Effects Assessment:    Are you experiencing any side effects? No    Are you experiencing difficulty administering your medicine? No    Quality of Life Assessment:    How many days over the past month did your HS  keep you from your normal activities? For example, brushing your teeth or getting up in the morning. Patient declined to answer    Have you discussed this with your provider? Not needed    Acute Infection Status:    Acute infections noted within Epic:  No active infections    Patient reported infection: None    Therapy Appropriateness:    Is therapy appropriate? Yes, therapy is appropriate and should be continued    DISEASE/MEDICATION-SPECIFIC INFORMATION      For patients on injectable medications: Patient currently has 0 doses left.  Next injection is scheduled for asap (will take 4/26, was due 4/23. didn't return calls from pharmacy).    PATIENT SPECIFIC NEEDS     - Does the patient have any physical, cognitive, or cultural barriers? No    - Is the patient high risk? No    - Does the patient require a Care Management Plan? No     - Does the patient require physician intervention or other additional services (i.e. nutrition, smoking cessation, social work)? No      SHIPPING     Specialty Medication(s) to be Shipped:   Inflammatory Disorders: Humira    Other medication(s) to be shipped: No additional medications requested for fill at this time     Changes to insurance: No    Delivery Scheduled: Yes, Expected medication delivery date: Tues, 4/26.     Medication will be delivered via UPS to the confirmed prescription address in [facility].    The patient will receive a drug information handout for each medication shipped and additional FDA Medication Guides as required.  Verified that patient has previously received a Conservation officer, historic buildings.    All of the patient's questions and concerns have been addressed.    [name]   [facility] Specialty Pharmacist

## 2021-04-06 ENCOUNTER — Emergency Department: Payer: Medicaid Other

## 2021-04-06 ENCOUNTER — Encounter: Payer: Self-pay | Admitting: Emergency Medicine

## 2021-04-06 ENCOUNTER — Emergency Department
Admission: EM | Admit: 2021-04-06 | Discharge: 2021-04-06 | Disposition: A | Discharge status: Home / Self Care | Payer: Medicaid Other | Attending: Emergency Medicine | Admitting: Emergency Medicine

## 2021-04-06 ENCOUNTER — Other Ambulatory Visit: Payer: Self-pay

## 2021-04-06 DIAGNOSIS — Z20822 Contact with and (suspected) exposure to covid-19: Secondary | ICD-10-CM | POA: Diagnosis not present

## 2021-04-06 DIAGNOSIS — Z28310 Unvaccinated for covid-19: Secondary | ICD-10-CM | POA: Insufficient documentation

## 2021-04-06 DIAGNOSIS — G5692 Unspecified mononeuropathy of left upper limb: Secondary | ICD-10-CM | POA: Diagnosis not present

## 2021-04-06 DIAGNOSIS — J029 Acute pharyngitis, unspecified: Secondary | ICD-10-CM | POA: Diagnosis present

## 2021-04-06 LAB — GROUP A STREP BY PCR: Group A Strep by PCR: NOT DETECTED

## 2021-04-06 NOTE — ED Provider Notes (Signed)
[facility] Emergency Department Provider Note   ____________________________________________   Event Date/Time   First MD Initiated Contact with Patient 04/06/21 1348     (approximate)  I have reviewed the triage vital signs and the nursing notes.   HISTORY  Chief Complaint URI    HPI [name] [name] is a 40 y.o. female patient complain of sore throat x1 day.  Patient also complain left wrist pain and sinus congestion.  Patient states no provocative incident for left wrist pain.  Patient describes her wrist pain as "sharp radiating from the wrist to the elbow.  Patient denies recent travel or known contact with COVID-19.  Patient has not taken the COVID-vaccine or flu shot for the season.  Patient rates her pain discomfort a 5/10.  No palliative measure for complaint.  Patient is right-hand dominant.     History reviewed. No pertinent past medical history.  There are no problems to display for this patient.   History reviewed. No pertinent surgical history.  Prior to Admission medications   Not on File    Allergies Patient has no known allergies.  No family history on file.  Social History Social History   Tobacco Use  . Smoking status: Never Smoker  . Smokeless tobacco: Never Used  Substance Use Topics  . Alcohol use: Yes  . Drug use: Never    Review of Systems  Constitutional: No fever/chills Eyes: No visual changes. ENT: Sore throat.  Cardiovascular: Denies chest pain. Respiratory: Denies shortness of breath. Gastrointestinal: No abdominal pain.  No nausea, no vomiting.  No diarrhea.  No constipation. Genitourinary: Negative for dysuria. Musculoskeletal: Left wrist pain.   Skin: Negative for rash. Neurological: Negative for headaches, focal weakness or numbness. ____________________________________________   PHYSICAL EXAM:  VITAL SIGNS: ED Triage Vitals  Enc Vitals Group     BP 04/06/21 1345 (!) 150/82     Pulse Rate  04/06/21 1345 100     Resp 04/06/21 1345 (!) 24     Temp 04/06/21 1345 98.6 F (37 C)     Temp Source 04/06/21 1345 Oral     SpO2 04/06/21 1345 95 %     Weight 04/06/21 1342 (!) 300 lb 0.7 oz (136.1 kg)     Height 04/06/21 1342 5\' 4"  (1.626 m)     Head Circumference --      Peak Flow --      Pain Score 04/06/21 1342 5     Pain Loc --      Pain Edu? --      Excl. in GC? --     Constitutional: Alert and oriented. Well appearing and in no acute distress. Eyes: Conjunctivae are normal. PERRL. EOMI. Head: Atraumatic. Nose: No congestion/rhinnorhea. Mouth/Throat: Mucous membranes are moist.  Oropharynx non-erythematous. Neck: No stridor.   Hematological/Lymphatic/Immunilogical: No cervical lymphadenopathy. Cardiovascular: Normal rate, regular rhythm. Grossly normal heart sounds.  Good peripheral circulation. Respiratory: Normal respiratory effort.  No retractions. Lungs CTAB. Gastrointestinal: Soft and nontender. No distention. No abdominal bruits. No CVA tenderness. Genitourinary: Deferred Musculoskeletal: No obvious deformity to the left wrist.  Patient has full Nikkel range of motion.  Neurovascular intact.  Negative Tinel's or Phalen's test. Neurologic:  Normal speech and language. No gross focal neurologic deficits are appreciated. No gait instability. Skin:  Skin is warm, dry and intact. No rash noted. Psychiatric: Mood and affect are normal. Speech and behavior are normal.  ____________________________________________   LABS (all labs ordered are listed, but only abnormal results  are displayed)  Labs Reviewed  GROUP A STREP BY PCR  SARS CORONAVIRUS 2 (TAT 6-24 HRS)   ____________________________________________  EKG   ____________________________________________  RADIOLOGY I, Joni Reining, personally viewed and evaluated these images (plain radiographs) as part of my medical decision making, as well as reviewing the written report by the radiologist.  ED MD  interpretation: No acute findings on cervical spine x-ray. Official radiology report(s): DG Cervical Spine Complete  Result Date: 04/06/2021 CLINICAL DATA:  Neck pain with radiculopathy. EXAM: CERVICAL SPINE - COMPLETE 4+ VIEW COMPARISON:  None. FINDINGS: Normal alignment. The vertebral body heights and disc spaces are well preserved. No fracture or dislocation identified. The neural foramina appear patent bilaterally. IMPRESSION: Negative cervical spine radiographs. Electronically Signed   By: Signa Kell M.D.   On: 04/06/2021 15:02    ____________________________________________   PROCEDURES  Procedure(s) performed (including Critical Care):  Procedures   ____________________________________________   INITIAL IMPRESSION / ASSESSMENT AND PLAN / ED COURSE  As part of my medical decision making, I reviewed the following data within the electronic MEDICAL RECORD NUMBER         Patient presents with sore throat left wrist pain and sinus congestion.  Patient rapid strep test was negative.  COVID-19 test results are pending be found later in the MyChart app.  Discussed no acute findings on cervical spine x-ray.  Patient complaint and exam is consistent with viral pharyngitis and neuropathy to the left upper extremity.  Patient given discharge care instruction advised to follow-up with neurology for definitive valuation treatment of left wrist complaint.      ____________________________________________   FINAL CLINICAL IMPRESSION(S) / ED DIAGNOSES  Final diagnoses:  Viral pharyngitis  Neuropathy of left upper extremity     ED Discharge Orders    None      *Please note:  [name] [name] was evaluated in Emergency Department on 04/06/2021 for the symptoms described in the history of present illness. She was evaluated in the context of the global COVID-19 pandemic, which necessitated consideration that the patient might be at risk for infection with the SARS-CoV-2 virus that causes  COVID-19. Institutional protocols and algorithms that pertain to the evaluation of patients at risk for COVID-19 are in a state of rapid change based on information released by regulatory bodies including the CDC and federal and state organizations. These policies and algorithms were followed during the patient's care in the ED.  Some ED evaluations and interventions may be delayed as a result of limited staffing during and the pandemic.*   Note:  This document was prepared using Dragon voice recognition software and may include unintentional dictation errors.    Joni Reining, PA-C 04/06/21 1557    [place]ton [place], MD 04/06/21 [phone]

## 2021-04-06 NOTE — Discharge Instructions (Addendum)
No acute findings on his x-ray of the cervical spine.  Your rapid strep test was negative.  Your COVID results are pending and may review later today in the MyChart app.  Wear wrist splint and follow-up with neurology for definitive evaluation and treatment.

## 2021-04-06 NOTE — ED Triage Notes (Signed)
C/O sore throat x 1 day, left wrist pain x 1 day and sinus congestion.  AAOx3  Skin warm and dry. NAD

## 2021-04-06 NOTE — ED Notes (Signed)
This tech performed an EKG on pt. Pt sounded short of breath ambulating to room. This tech placed pt on the pulse on monitor. Pt was tachy with a HR of 138 and sats of 93%.  Jaclynn Guarneri, RN aware.  EKG performed. Pt mentioned to this tech, "I was diagnose with a murmur." Pt was unable to remember when she got diagnose with it.

## 2021-04-06 NOTE — ED Notes (Signed)
Pt verbalized understanding of d/c instructions at this time. Follow-up care reviewed at this time. Pt assisted to ED entrance by [facility] EDT in wheelchair at this time

## 2021-04-07 LAB — SARS CORONAVIRUS 2 (TAT 6-24 HRS): SARS Coronavirus 2: NEGATIVE

## 2021-04-07 NOTE — Unmapped (Signed)
[facility] Refill Coordination Note    Specialty Medication(s) to be Shipped:   Inflammatory Disorders: Humira    Other medication(s) to be shipped: No additional medications requested for fill at this time     Kelly May, DOB: 09-Jan-1981  Phone: [phone] (home)       All above HIPAA information was verified with patient.     Was a Nurse, learning disability used for this call? No    Completed refill call assessment today to schedule patient's medication shipment from the [facility] [phone]).  All relevant notes have been reviewed.     Specialty medication(s) and dose(s) confirmed: Regimen is correct and unchanged.   Changes to medications: Belanna reports no changes at this time.  Changes to insurance: No  New side effects reported not previously addressed with a pharmacist or physician: None reported  Questions for the pharmacist: No    Confirmed patient received a Conservation officer, historic buildings and a Surveyor, mining with first shipment. The patient will receive a drug information handout for each medication shipped and additional FDA Medication Guides as required.       DISEASE/MEDICATION-SPECIFIC INFORMATION        For patients on injectable medications: Patient currently has 1 doses left.  Next injection is scheduled for 04/09/2021.    SPECIALTY MEDICATION ADHERENCE     Medication Adherence    Patient reported X missed doses in the last month: 0  Specialty Medication: Humira Cf 40 mg/.4 ml  Patient is on additional specialty medications: No  Any gaps in refill history greater than 2 weeks in the last 3 months: no  Demonstrates understanding of importance of adherence: yes  Informant: patient  Reliability of informant: reliable  Confirmed plan for next specialty medication refill: delivery by pharmacy  Refills needed for supportive medications: not needed              Were doses missed due to medication being on hold? No    Humira CF 40/0.4 mg/ml: 7 days of medicine on hand REFERRAL TO PHARMACIST     Referral to the pharmacist: Not needed      [place]     Shipping address confirmed in Epic.     Delivery Scheduled: Yes, Expected medication delivery date: 04/12/2021.     Medication will be delivered via UPS to the prescription address in Epic WAM.    Tamaria Dunleavy D Preethi Scantlebury   [facility] Specialty Technician

## 2021-04-11 MED FILL — HUMIRA PEN CITRATE FREE 40 MG/0.4 ML: SUBCUTANEOUS | 28 days supply | Qty: 4 | Fill #1

## 2021-04-28 ENCOUNTER — Encounter: Admit: 2021-04-28 | Discharge: 2021-04-29 | Payer: PRIVATE HEALTH INSURANCE | Attending: Medical | Primary: Medical

## 2021-04-28 DIAGNOSIS — F331 Major depressive disorder, recurrent, moderate: Principal | ICD-10-CM

## 2021-04-28 DIAGNOSIS — E119 Type 2 diabetes mellitus without complications: Principal | ICD-10-CM

## 2021-04-28 DIAGNOSIS — L732 Hidradenitis suppurativa: Principal | ICD-10-CM

## 2021-04-28 DIAGNOSIS — G4733 Obstructive sleep apnea (adult) (pediatric): Principal | ICD-10-CM

## 2021-04-28 DIAGNOSIS — M25561 Pain in right knee: Principal | ICD-10-CM

## 2021-04-28 DIAGNOSIS — R609 Edema, unspecified: Principal | ICD-10-CM

## 2021-04-28 DIAGNOSIS — R0602 Shortness of breath: Principal | ICD-10-CM

## 2021-04-28 DIAGNOSIS — Z0000 Encounter for general adult medical examination without abnormal findings: Principal | ICD-10-CM

## 2021-04-28 DIAGNOSIS — E0500 Thyrotoxicosis with diffuse goiter without thyrotoxic crisis or storm: Principal | ICD-10-CM

## 2021-04-28 DIAGNOSIS — N939 Abnormal uterine and vaginal bleeding, unspecified: Principal | ICD-10-CM

## 2021-04-28 DIAGNOSIS — M5440 Lumbago with sciatica, unspecified side: Principal | ICD-10-CM

## 2021-04-28 LAB — COMPREHENSIVE METABOLIC PANEL
ALBUMIN: 3 g/dL — ABNORMAL LOW (ref 3.4–5.0)
ALKALINE PHOSPHATASE: 107 U/L (ref 46–116)
ALT (SGPT): 134 U/L — ABNORMAL HIGH (ref 10–49)
ANION GAP: 7 mmol/L (ref 5–14)
AST (SGOT): 99 U/L — ABNORMAL HIGH (ref <=34)
BILIRUBIN TOTAL: 0.4 mg/dL (ref 0.3–1.2)
BLOOD UREA NITROGEN: 10 mg/dL (ref 9–23)
BUN / CREAT RATIO: 17
CALCIUM: 8.9 mg/dL (ref 8.7–10.4)
CHLORIDE: 106 mmol/L (ref 98–107)
CO2: 26.8 mmol/L (ref 20.0–31.0)
CREATININE: 0.6 mg/dL
EGFR CKD-EPI (2021) FEMALE: >90 mL/min/{1.73_m2} (ref >=60)
GLUCOSE RANDOM: 109 mg/dL — ABNORMAL HIGH (ref 70–99)
POTASSIUM: 4.5 mmol/L (ref 3.4–4.8)
PROTEIN TOTAL: 7.4 g/dL (ref 5.7–8.2)
SODIUM: 140 mmol/L (ref 135–145)

## 2021-04-28 LAB — LIPID PANEL
CHOLESTEROL/HDL RATIO SCREEN: 4.2 (ref 1.0–4.5)
CHOLESTEROL: 176 mg/dL (ref <=200)
HDL CHOLESTEROL: 42 mg/dL (ref 40–60)
LDL CHOLESTEROL CALCULATED: 109 mg/dL — ABNORMAL HIGH (ref 40–99)
NON-HDL CHOLESTEROL: 134 mg/dL — ABNORMAL HIGH (ref 70–130)
TRIGLYCERIDES: 126 mg/dL (ref 0–150)
VLDL CHOLESTEROL CAL: 25.2 mg/dL (ref 8–32)

## 2021-04-28 LAB — TSH: THYROID STIMULATING HORMONE: 2.608 u[IU]/mL (ref 0.550–4.780)

## 2021-04-28 LAB — ALBUMIN / CREATININE URINE RATIO
ALBUMIN QUANT URINE: 1.1 mg/dL
ALBUMIN/CREATININE RATIO: 5.3 ug/mg (ref 0.0–30.0)
CREATININE, URINE: 209 mg/dL

## 2021-04-28 LAB — T4, FREE: FREE T4: 1.18 ng/dL (ref 0.89–1.76)

## 2021-04-28 LAB — T3: T3 TOTAL: 114 ng/dL (ref 60.0–180.0)

## 2021-04-28 MED ORDER — PROAIR HFA 90 MCG/ACTUATION AEROSOL INHALER
RESPIRATORY_TRACT | 2 refills | 0.00000 days | Status: CP | PRN
Start: 2021-04-28 — End: ?

## 2021-04-28 MED ORDER — METHOCARBAMOL 750 MG TABLET
ORAL_TABLET | Freq: Three times a day (TID) | ORAL | 0 refills | 20.00000 days | Status: CP | PRN
Start: 2021-04-28 — End: ?

## 2021-04-28 MED ORDER — METFORMIN ER 500 MG TABLET,EXTENDED RELEASE 24 HR
ORAL_TABLET | Freq: Two times a day (BID) | ORAL | 3 refills | 90 days | Status: CP
Start: 2021-04-28 — End: ?

## 2021-04-28 MED ORDER — HYDROCHLOROTHIAZIDE 12.5 MG CAPSULE
ORAL_CAPSULE | Freq: Every morning | ORAL | 11 refills | 30 days | Status: CP
Start: 2021-04-28 — End: 2022-04-28

## 2021-04-28 MED ORDER — FLUTICASONE PROPIONATE 50 MCG/ACTUATION NASAL SPRAY,SUSPENSION
Freq: Every day | NASAL | 11 refills | 0 days | Status: CP
Start: 2021-04-28 — End: 2022-04-28

## 2021-04-28 MED ORDER — NAPROXEN 500 MG TABLET
ORAL_TABLET | Freq: Two times a day (BID) | ORAL | 1 refills | 30 days | Status: CP | PRN
Start: 2021-04-28 — End: ?

## 2021-04-28 MED ORDER — BUPROPION HCL XL 150 MG 24 HR TABLET, EXTENDED RELEASE
ORAL_TABLET | Freq: Every morning | ORAL | 3 refills | 90 days | Status: CP
Start: 2021-04-28 — End: 2022-04-28

## 2021-04-28 NOTE — Unmapped (Incomplete)
Name:  Kelly May  DOB: 01/21/1981  Today's Date: 04/28/2021  Age:  40 y.o.    A/P:  Problem List Items Addressed This Visit        Endocrine    Type 2 diabetes mellitus without complication, without long-term current use of insulin (CMS-HCC)     Restart metformin.  Labs as below.           Relevant Medications    metFORMIN (GLUCOPHAGE-XR) 500 MG 24 hr tablet    Other Relevant Orders    POCT glycosylated hemoglobin (Hb A1C) (Completed)    Albumin/creatinine urine ratio (Completed)    Comprehensive Metabolic Panel (Completed)    Lipid Panel (Completed)    Graves disease - Primary     Lost to follow-up with endo.  Labs as below.  Encouraged patient to follow-up with endocrinology            Relevant Medications    naproxen (NAPROSYN) 500 MG tablet    Other Relevant Orders    TSH (Completed)    T4, Free (Completed)    T3 (Completed)       Respiratory    OSA (obstructive sleep apnea)     Last PSG and CPAP titration 02/2020,Needs CPAP. Refill to sleep clinic           Relevant Medications    PROAIR HFA 90 mcg/actuation inhaler    Other Relevant Orders    Ambulatory referral to Sleep Clinic       Musculoskeletal and Integument    Hidradenitis suppurativa     Encouraged to follow back up with dermatology              Genitourinary    Abnormal uterine bleeding (AUB)     Hx of AUB, Reports menses lasting ~19 days, passing clots   -Referral to ob/gyn for further eval           Relevant Orders    Ambulatory referral to Obstetrics / Gynecology       Other    SOB (shortness of breath)     Reported hx of COPD, hx of tobacco abuse.   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  Denies HI or SI.  Reports she stopped Prozac but wishes to restart Wellbutrin.  Refilled today.    GAD7 Total Score GAD-7 Total Score   05/13/2020 20   04/01/2020 21     PHQ-9 PHQ-9 TOTAL SCORE   04/28/2021 15   05/13/2020 18   04/01/2020 19                Relevant Medications    buPROPion (WELLBUTRIN XL) 150 MG 24 hr tablet    Acute pain of right knee     Right knee pain worse with stairs.  Suspect OA versus patellofemoral syndrome.  Referral to orthopedics for further eval           Relevant  Orders    Ambulatory referral to Orthopedic Surgery          Medication adherence and barriers to the treatment plan have been addressed. Opportunities to optimize healthy behaviors have been discussed. Patient / caregiver voiced understanding.      Return in about 6 weeks (around 06/09/2021) for Recheck.    S:  Kelly May is a 40 y.o. female who presents for follow-up of chronic disease management.  Unfortunately she has not been seen in some time and has run out of nearly all of her medications.  Today's visit initially scheduled as a physical; however, the bulk of today's visit was spent reviewing her chronic health conditions and making sure she had appropriate refills as well as updated labs.      Graves disease  At last visit started metoprolol XL 12.5 mg daily. She is following with Endocrinology. She was also taking Tapazole 10 mg daily.  Overdue for follow-up with endocrinology and no longer taking Tapazole    Hidradenitis suppurativa  Patient is followed with Dermatology. At last visit, she suggested starting humira 40 mg weekly.      MDD/Anxiety  At last visit, patient was recommended to Wellbutrin XL 150 mg daily and was recommended to find long term therapist. She was currently taking Prozac 10 mg daily.   She is no longer taking Wellbutrin XL  Pt reports does not feel depressed, but ADHD feels severe.   No longer taking prozac  Feels limited due to mental and physical health    Tobacco Abuse  Patient was prescribed a course of Chantix. She takes Advertising account planner daily.    Type 2 Diabetes  Patient was currently taking metformin 500 mg twice daily but is no longer taking this.     Knee Pain  Patient states severe pain limiting ability to walk. Pain worse with stairs                 ROS:  A 12 point review of systems was negative except for pertinent items noted in the HPI.    Past medical history, family history, surgical history and social history personally reviewed and verified with patient.     PROBLEM LIST  Patient Active Problem List   Diagnosis   ??? OSA (obstructive sleep apnea)   ??? Morbid obesity (CMS-HCC)   ??? Headache, acute   ??? Postpartum care following vaginal delivery   ??? Annual physical exam   ??? SOB (shortness of breath)   ??? Fatigue   ??? Peripheral edema   ??? Chronic low back pain without sciatica   ??? Vitamin D deficiency   ??? Type 2 diabetes mellitus without complication, without long-term current use of insulin (CMS-HCC)   ??? Acquired hypothyroidism   ??? Acute bilateral low back pain with sciatica   ??? Graves disease   ??? Routine screening for STI (sexually transmitted infection)   ??? 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 Hidradenitis suppurativa   ??? Tobacco abuse   ??? Rectal bleeding   ??? Acute pain of right knee   ??? Abnormal uterine bleeding (AUB)       O:  Vitals:    04/28/21 1422   BP: 135/88   Pulse: 100   Temp: 36.6 ??C (97.8 ??F)   SpO2: 97%     General:  Well appearing, well nourished in no distress.   Skin: no obvious rash or  prominent lesions    Lymphatic: no cervical LAD. No  TM  Cardiovascular:  rrr no mrg, no peripheral edema  Eyes:  conjunctiva clear, sclera non-icteric   Ears/nose/throat: no obvious external deformities  Respiratory: CTAB, breathing comfortably  Gastrointestinal: soft NTND no masses  Musculoskeletal:  Normal gait and station.  Exam limited by time constraints  Neurologic: moves extremities symmetrically, cranial nerves grossly intact  Hematologic: no obvious ecchymosis     Psychiatric:  Oriented X3, intact judgement and insight, normal mood and affect.          Malikiah Debarr A Chalyn Amescua, PAC COVID-19 Booster:   - A1C: ordered today  -Urine Albumin/Creatinine Ratio:     Immunizations:     Immunization History   Administered Date(s) Administered   ??? COVID-19 VACCINE,MRNA(MODERNA)(PF)(IM) 02/11/2020, 04/01/2020   ??? Hep A / Hep B 01/29/2009, 03/02/2009   ??? INFLUENZA TIV (TRI) PF (IM) 08/30/2012   ??? Influenza Vaccine Quad (IIV4 PF) 65mo+ injectable 10/31/2013, 11/10/2019   ??? PNEUMOCOCCAL POLYSACCHARIDE 23 11/10/2019   ??? TdaP 01/16/2013   ??? Tetanus and diptheria, adsorbed,(adult), 5Lf tetanus toxoid,PF 03/02/2009   ??? Tetanus-Diptheria Toxoids-TD(TDVAX),Asdorbed,2LF(IM) 03/02/2009       I have reviewed and (if needed) updated the patient's problem list, medications, allergies, past medical and surgical history, preventive services, and social and family history.    ROS:     A 12 point review of systems was negative except for pertinent items noted in the HPI.    Vital Signs:     Wt Readings from Last 3 Encounters:   04/28/21 (!) 167.4 kg (369 lb 1.9 oz)   07/20/20 (!) 161.1 kg (355 lb 3.2 oz)   05/13/20 (!) 161 kg (355 lb)     Temp Readings from Last 3 Encounters:   04/28/21 36.6 ??C (97.8 ??F)   07/20/20 36.8 ??C (98.2 ??F) (Oral)   05/13/20 36.9 ??C (98.4 ??F) (Temporal)     BP Readings from Last 3 Encounters:   04/28/21 135/88   07/20/20 93/60   05/13/20 126/85     Pulse Readings from Last 3 Encounters:   04/28/21 100   07/20/20 (!) 18   05/13/20 111     Estimated body mass index is 60.04 kg/m?? as calculated from the following:    Height as of this encounter: 167 cm (5' 5.75).    Weight as of this encounter: 167.4 kg (369 lb 1.9 oz).  Facility age limit for growth percentiles is 20 years.        Objective:     General Appearance: Alert, cooperative, no distress, appears stated age.   Head and Neck: Normocephalic, atraumatic. No  masses. No thyromegaly. No bruits.   EENT: Eyes: PERRLA, Conjuntiva clear; Ears: bilateral TMs normal; Nose: septum and turbinates unremarkable; Throat: mucus membranes moist. No tonsilar enlargement or exudates. No OP erythema.  Cardiovascular:  Regular rate and rhythm. No murmurs  Respiratory: Lungs clear to auscultation bilaterally,  Respiratory effort unremarkable. No wheezes or rhonchi.   Breast: {ammbreastexam:[id number]}  Gastrointestinal: Abdomen soft and non-tender. No organomegaly or masses.   Genitourinary: deferred  Musculoskeletal: Gait and station unremarkable. Normal ROM. Normal strength and tone.   Extremities :No edema.  Peripheral pulses normal  Integumentary: Warm and dry.No rashes.  No abnormal appearing skin lesions  LYMPH NODES: Cervical, supraclavicular, and axillary nodes normal  Neurologic: Alert and oriented to place and time. Normal deep tendon reflexes. CN II-XII grossly intact.   Psychiatric: Mood and affect appropriate for situation.       I attest that I, Katheran Awe Rindoks,  personally documented this note while acting as scribe for [employer], PAC.      Cassidy E Rindoks, Scribe.  04/28/2021     The documentation recorded by the scribe accurately reflects the service I personally performed and the decisions made by me.    Ferrin Liebig A Sharronda Schweers, PAC

## 2021-05-03 ENCOUNTER — Encounter
Admit: 2021-05-03 | Discharge: 2021-05-04 | Payer: PRIVATE HEALTH INSURANCE | Attending: Internal Medicine | Primary: Internal Medicine

## 2021-05-03 DIAGNOSIS — Z6841 Body Mass Index (BMI) 40.0 and over, adult: Principal | ICD-10-CM

## 2021-05-03 NOTE — Unmapped (Signed)
Assessment and Plan:  #Graves disease, + TSI: In remission based on recent labs performed by primary. She will need to remain vigilant for symptoms of recurrence and contact our office as soon as possible if this develops.     #Type 2 DM: Last A1c of 6.0 on Metformin 500 daily.  Continue management through primary care doctor    #Obesity: Patient has pale striae, obesity, dorsal cervical adiposity, and hirsutism.  Given the a menstrual irregularities I suspect she has PCOS however feel that ruling out Cushing's syndrome would be of value as this is part of the evaluation for PCOS. Will complete NM salivary x 3 and 24 hr UFC.     Follow-up in 8 weeks in clinic.    The patient was discussed with Dr. Tamala Ser was available.  --  Kelly Hess, MD  Endocrinology Fellow  [facility] at [facility]  Phone: [phone]   Fax: [phone]    ----------------------------------------------------------------------------------------  History of Presenting Illness:  Kelly May is a 40 y.o. female who is seen in consultation for abnormalities on thyroid function studies at the request of [address], [address], *.  Interval history:  Last followup in August 2021. She ran out of MMZ and stopped taking. Labs with primary earlier this month were normal regarding thyroid function.    Weight is increased over 2 year period.  She has  irregular menstrual cycle with menorrhagia lasting a month at times.  She has developed acne in her face as well as hirsutism. No bruising.    She never completed 24hr UFC ordered at last visit.     Outside labs do not show hypokalemia but do show mild transaminase elevation (more consistent with metabolic syndrome than Cushings). A1c stable     Initial history:  Kelly May is a 40 year old female who is seen in consultation to evaluate abnormalities observed on thyroid function studies last month.  She reports having a history of hypothyroidism diagnosed somewhere between 2015 and 2017 while receiving care at an outside clinic.  I do not have access to these records.  She was briefly on levothyroxine for a year or 2 and subsequently was told to discontinue because thyroid function had normalized.  She has been off levothyroxine for roughly 2 years per her report.  Thyroid function studies were obtained last month as part of routine screening labs given her history of hypothyroidism.  These laboratory studies showed suppressed TSH with mildly elevated peripheral thyroid hormone levels.  Repeat labs showed similar findings several weeks later.  Symptom wise, she reports chronic but stable anxiety and heat intolerance which has been ongoing for years.  She also reports occasional rapid heart rate greater than 100 bpm with palpitations that are mild and infrequent.  She believes she has an upper extremity tremor that is intermittently present and not present today.  She denies any unintended weight loss.  No changes in voice nor globus sensation or tenderness in the throat or over the anterior neck.  There is been no recent upper respiratory illness.    She does not have any family history of thyroid disease.    She does not use biotin or hair and nail supplements.  No over-the-counter supplements such as thyroid extract or thyroid support supplements.  She has not used previously prescribed levothyroxine in over 2 years.    Patient was also diagnosed with type 2 diabetes based on an A1c of 6.5 and started on Metformin.  This is a new diagnosis  and new therapy.    Patient has had obesity for most of her life and reports weight gain of 70 pounds over the last year.     A 10 system ROS was completed and is negative other than that in HPI.    Past Medical History:   Diagnosis Date   ??? Abnormal TSH 11/27/2019   ??? Acquired hypothyroidism 11/27/2019   ??? Acute bilateral low back pain with sciatica    ??? Anemia    ??? Anxiety    ??? COPD (chronic obstructive pulmonary disease) (CMS-HCC)    ??? Dysthymia    ??? Encounter for supervision of normal pregnancy in multigravida 11/21/2012    FWB: Weekly BPPs starting @ 32-34 weeks for BMI>40. EFW: 2731 gms 6 lbs 0 oz (52%) at 34 1/7.  EFW: 3739 gms 8 lbs 4 oz (83%) at 37 6/7 GBS: neg MOD: Anticipate SVD. Feeding:  PP: LARC (IUD and*   ??? Graves disease    ??? Headache(784.0)    ??? Obesity    ??? OSA (obstructive sleep apnea)    ??? Sleep apnea    ??? Substance abuse (CMS-HCC)    ??? Type 2 diabetes mellitus without complication, without long-term current use of insulin (CMS-HCC) 11/27/2019      Surgical History:  Cholecystectomy    Current Outpatient Medications:   ???  buPROPion (WELLBUTRIN XL) 150 MG 24 hr tablet, Take 1 tablet (150 mg total) by mouth every morning., Disp: 90 tablet, Rfl: 3  ???  empty container (SHARPS CONTAINER) Misc, Use as directed, Disp: 1 each, Rfl: 2  ???  fluticasone propionate (FLONASE) 50 mcg/actuation nasal spray, 1 spray into each nostril in the morning., Disp: 16 g, Rfl: 11  ???  HUMIRA PEN CITRATE FREE 40 MG/0.4 ML, Inject the contents of 1 pen (40 mg total) under the skin once a week., Disp: 4 each, Rfl: 3  ???  hydroCHLOROthiazide (MICROZIDE) 12.5 mg capsule, Take 1 capsule (12.5 mg total) by mouth every morning., Disp: 30 capsule, Rfl: 11  ???  metFORMIN (GLUCOPHAGE-XR) 500 MG 24 hr tablet, Take 1 tablet (500 mg total) by mouth in the morning and 1 tablet (500 mg total) in the evening. Take with meals., Disp: 180 tablet, Rfl: 3  ???  meTHIMazole (TAPAZOLE) 5 MG tablet, Take 2 tablets (10 mg total) by mouth daily., Disp: 60 tablet, Rfl: 0  ???  methocarbamoL (ROBAXIN) 750 MG tablet, Take 1 tablet (750 mg total) by mouth Three (3) times a day as needed., Disp: 60 tablet, Rfl: 0  ???  metoprolol succinate (TOPROL XL) 25 MG 24 hr tablet, Take 1 tablet (25 mg total) by mouth daily., Disp: 90 tablet, Rfl: 3  ???  naproxen (NAPROSYN) 500 MG tablet, Take 1 tablet (500 mg total) by mouth two (2) times a day as needed. TAKE with food, Disp: 60 tablet, Rfl: 1  ???  PROAIR HFA 90 mcg/actuation inhaler, Inhale 2 puffs every four (4) hours as needed for wheezing., Disp: 16 g, Rfl: 2  ???  HUMIRA PEN CITRATE FREE STARTER PACK FOR CROHN'S/UC/HS 3 X 80 MG/0.8 ML, Inject the contents of 2 pens (160 mg) under the skin on day 1, then 1 pen (80 mg) 2 weeks later (day 15)., Disp: 3 each, Rfl: 0      Family History   Problem Relation Age of Onset   ??? Hypertension Mother    ??? Osteoporosis Mother    ??? Tremor Mother    ??? Diabetes Maternal Aunt    ???  COPD Father    ??? Asthma Brother    ??? Asthma Brother    ??? COPD Maternal Grandfather    ??? Diabetes Maternal Aunt    ??? Diabetes Maternal Uncle    ??? Diabetes Maternal Uncle    ??? Stroke Maternal Uncle    ??? Osteoporosis Maternal Grandmother    ??? Melanoma Neg Hx    ??? Squamous cell carcinoma Neg Hx    ??? Basal cell carcinoma Neg Hx       Social History:  Works from home  No etoh  Tobacco use 1/2 ppd x 23 years  Rare marijuana    Physical Exam  BP 127/86  - Pulse 104  - Ht 167 cm (5' 5.75)  - Wt (!) 168.9 kg (372 lb 6.4 oz)  - BMI 60.56 kg/m??   GENERAL APPEARANCE: Well developed, well nourished, alert and cooperative, and appears to be in no acute distress.  HEAD: normocephalic. Moon facies, mild pustular acne with mild hirsutism along the chin line  NEURO: No tremor appreciated, reflexes 2+ throughout  EYE: no obvious proptosis  Abdomen/GI: Soft and nontender with pale pink stretch marks dinner than the width of the finger.  Extremities: Nonpitting edema in the lower extremities bilaterally    Relevant labs and studies not mentioned above:    EXAM: US THYROID  DATE: 12/22/2019 4:19 PM  ACCESSION: [id number]  DICTATED: 12/22/2019 4:20 PM  INTERPRETATION LOCATION: [facility]  ??  CLINICAL INDICATION: 40 years old Female with hyperthyroidism  - R79.89 - Abnormal TSH   ??  COMPARISON: None available.  ??  TECHNIQUE:  Ultrasound views of the thyroid were obtained using gray scale and limited color Doppler imaging.  ??  FINDINGS:  Somewhat limited study overall due to patient body habitus.  ??  Thyroid size: see below  ??  Thyroid echotexture: Diffusely mildly heterogenous thyroid. No suspicious thyroid nodules identified. Multiple subcentimeter hypoechoic lesions in the bilateral thyroid, which does not meet TIRADS criteria for follow-up.  ??  Lymph nodes: No adenopathy  ??  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  No suspicious thyroid nodules identified.        -------------------------------------------------------------------------------------------------------------------------------

## 2021-05-03 NOTE — Unmapped (Signed)
24 hour urine collection instructions:     1. Make sure your name and the date are on the collection jug.   2. First thing in the morning urinate and DISCARD.  This is done in order to start with an empty bladder. Note the exact time you do this. The collection starts with the second morning urine. Then collect all urine for 24 hours including the first urine the following morning. Note the exact time you complete the collection. Keep in a cool place but do not freeze.   3. Return the jug(s) to the lab, ideally the same day you finish the collection.     Do not use any steroid ointments or inhalers while doing the collection because they give false positive results.

## 2021-05-04 NOTE — Unmapped (Signed)
[facility] Refill Coordination Note    Specialty Medication(s) to be Shipped:   Inflammatory Disorders: Humira    Other medication(s) to be shipped: No additional medications requested for fill at this time     Kelly May, DOB: [birth year]  Phone: [phone] (home)       All above HIPAA information was verified with patient.     Was a Nurse, learning disability used for this call? No    Completed refill call assessment today to schedule patient's medication shipment from the [facility] [phone]).  All relevant notes have been reviewed.     Specialty medication(s) and dose(s) confirmed: Regimen is correct and unchanged.   Changes to medications: Caroleena reports no changes at this time.  Changes to insurance: No  New side effects reported not previously addressed with a pharmacist or physician: None reported  Questions for the pharmacist: No    Confirmed patient received a Conservation officer, historic buildings and a Surveyor, mining with first shipment. The patient will receive a drug information handout for each medication shipped and additional FDA Medication Guides as required.       DISEASE/MEDICATION-SPECIFIC INFORMATION        For patients on injectable medications: Patient currently has 1 doses left.  Next injection is scheduled for 05/07/2021.    SPECIALTY MEDICATION ADHERENCE     Medication Adherence    Patient reported X missed doses in the last month: 0  Specialty Medication: Humira Cf 40 mg/.4 ml  Patient is on additional specialty medications: No  Any gaps in refill history greater than 2 weeks in the last 3 months: no  Demonstrates understanding of importance of adherence: yes  Informant: patient  Reliability of informant: reliable  Confirmed plan for next specialty medication refill: delivery by pharmacy  Refills needed for supportive medications: not needed              Were doses missed due to medication being on hold? No    Humira CF 40/0.4 mg/ml: 7 days of medicine on hand REFERRAL TO PHARMACIST     Referral to the pharmacist: Not needed      [facility]     Shipping address confirmed in Epic.     Delivery Scheduled: Yes, Expected medication delivery date: 05/10/2021.     Medication will be delivered via UPS to the prescription address in Epic WAM.    [name]   [facility] Specialty Technician

## 2021-05-09 MED FILL — HUMIRA PEN CITRATE FREE 40 MG/0.4 ML: SUBCUTANEOUS | 28 days supply | Qty: 4 | Fill #2

## 2021-05-09 NOTE — Unmapped (Signed)
I reviewed with the resident the medical history and the resident/fellow???s findings on physical examination.  I discussed with the resident/fellow the patient???s diagnosis and concur with the treatment plan as documented in the resident/fellow note.   Lynford Citizen, MD, PhD  Associate Professor of Medicine  Division of Endocrinology  [phone]

## 2021-05-18 ENCOUNTER — Encounter
Admit: 2021-05-18 | Discharge: 2021-05-19 | Payer: PRIVATE HEALTH INSURANCE | Attending: Rehabilitative and Restorative Service Providers" | Primary: Rehabilitative and Restorative Service Providers"

## 2021-05-18 ENCOUNTER — Ambulatory Visit: Admit: 2021-05-18 | Discharge: 2021-05-19 | Payer: PRIVATE HEALTH INSURANCE

## 2021-05-18 MED ORDER — NAPROXEN 500 MG TABLET
ORAL_TABLET | Freq: Two times a day (BID) | ORAL | 1 refills | 30 days | Status: CP
Start: 2021-05-18 — End: ?

## 2021-05-18 MED ADMIN — triamcinolone acetonide (KENALOG-40) injection 40 mg: 40 mg | INTRA_ARTICULAR | @ 17:00:00 | Stop: 2021-05-18

## 2021-05-18 NOTE — Unmapped (Signed)
- Ice affected area for 15-20 minutes 2-3 times daily  - Elevate while icing  - Perform Home Exercises as described below  - If you have any questions or concerns, contact our sports team coverage line at [phone]    [employer]  Supplies:  Letta Pate ziploc bag  - Rubbing Alcohol  - Water    Combine 1?? cups of water with a ?? cup of rubbing alcohol into ziploc bag. Seal and put in the freezer for several hours or overnight.    Patient Instructions Following Cortisone Injections    A corticosteroid injection was performed for you in the office today. Your Knee was injected in order to reduce the pain and inflammation that you are experiencing.   Please note that not everyone will have a lasting response following the injection. A repeat injection may be needed if symptoms return, but these injections cannot be performed any sooner than 3-4 months apart.     Content of the Injection?????????????????????????????????????????????????????????The injection consists of three medications. Kenalog (an anti-inflammatory that will take 48-72 hours to take effect), Lidocaine ( a numbing agent that will last 30 minutes), and Ropivacaine (a numbing agent that will last 2-3 hours). Once the Ropivacaine wears off, you may have an increase in your pain. Icing the affected area for 20 minutes will help reduce this.  Post-injection Instructions???????????????????????????????????????????????????.It is recommended that you refrain from any high level activities using the joint or limb that was injected for approximately 4-7 days.    Possible Side Effects    Skin discoloration????????????????????????????????????..???????????????????????????Individuals with dark complexions may experience some skin discoloration locally at the site of the injection.  Steroid Flare-Up?????????????????????????????????????????????????????????????????????.There is the possibility of an increase in discomfort within 48 hours following the injection. This is called a ???flare???. To help avoid this, abide by the activity restriction above.  Infection?????????????????????????????????????????????????????????????????????????????????.There is a less than 1% chance of an infection. If you notice any signs of infection (redness, warmth, drainage, fever greater than 100 degrees) you should call [facility] immediately at [phone].      Knee flexion with heel slide    Lie on your back with your knees bent.  Slide your heel back by bending your affected knee as far as you can. Then hook your other foot around your ankle to help pull your heel even farther back.  Hold for about 6 seconds, then rest for up to 10 seconds.  Repeat 8 to 12 times.    [employer] with your leg straight and supported on the floor or a firm bed. (If you feel discomfort in the front or back of your knee, place a small towel roll under your knee.)  Tighten the muscles on top of your thigh by pressing the back of your knee flat down to the floor. (If you feel discomfort under your kneecap, place a small towel roll under your knee.)  Hold for about 6 seconds, then rest up to 10 seconds.  Do 8 to 12 repetitions several times a day.  Straight-leg raises to the front    Lie on your back with your good knee bent so that your foot rests flat on the floor. Your injured leg should be straight. Make sure that your low back has a normal curve. You should be able to slip your flat hand in between the floor and the small of your back, with your palm touching the floor and your back touching the back of your hand.  Tighten the thigh  muscles in the injured leg by pressing the back of your knee flat down to the floor. Hold your knee straight.  Keeping the thigh muscles tight, lift your injured leg up so that your heel is about 12 inches off the floor. Hold for 5 seconds and then lower slowly.  Do 8 to 12 repetitions.    Seated Toe Taps    Sit in chair with knee flexed to 90 degrees  Slowly flex the foot up, leaving the heel in contact with the ground, then slowly tap the foot to the ground.  Repeat this activity for 30 repetitions several times throughout the day.      Heel raises    Stand with your feet a few inches apart, with your hands lightly resting on a counter or chair in front of you.  Slowly raise your heels off the floor while keeping your knees straight.  Hold for about 6 seconds, then slowly lower your heels to the floor.  Do 8 to 12 repetitions several times during the day.      Wall squats with ball    Note: You will need a large therapy ball for this exercise. Ask your doctor or physical therapist what size you will need, but it should be large enough to cover your back.  Stand with your back facing a wall. Place your feet about a shoulder-width apart.  Place the therapy ball between your back and the wall, and move your feet out in front of you so they are about a foot in front of your hips.  Keep your arms at your sides, or put your hands on your hips.  Slowly squat down as if you are going to sit in a chair, rolling your back over the ball as you squat. The ball should move with you but stay pressed into the wall.  Be sure that your knees do not go in front of your toes as you squat.  Hold for 6 seconds.  Slowly rise to your standing position.  Repeat 8 to 12 times.

## 2021-05-18 NOTE — Unmapped (Signed)
ORTHOPAEDIC CLINIC NOTE  Date: 05/18/2021   [facility]    Primary Care Physician: Filomena Jungling, PAC  Kelly May is seen in consultation at the request of Tester, [address], * at [address] / [place] [address] for evaluation of right knee pain.    ASSESSMENT:  Kelly May is a 40 y.o. female with the following visit diagnoses:    ICD-10-CM   1. Primary osteoarthritis of right knee  M17.11       PLAN:    -   - Home exercise program provided  - Prescribed  Naprosyn for swelling and pain  - Ice affected area for 15-20 minutes every hour as needed  - Corticosteroid injection administered to the right knee joint as described below  - Follow-up as needed    Requested Prescriptions     Signed Prescriptions Disp Refills   ??? naproxen (NAPROSYN) 500 MG tablet 60 tablet 1     Sig: Take 1 tablet (500 mg total) by mouth in the morning and 1 tablet (500 mg total) in the evening. Take with meals.      Scheduling Notes:  Planned Return if symptoms worsen or fail to improve.  SUBJECTIVE:  Chief complaint: Knee pain   History of Present Illness:   (>4: location, quality, severity, timing, duration, context, modifying factors, associated symptoms)  40 y.o. female who presents for evaluation of Right Knee pain.   Mechanism of Injury: none, date: acute on chronic, patient reports several months of right knee pain without any injury or trauma. Her symptoms have gradually worsened over time. She avoids stairs knowing this will increase her pain. She has tried topical NSAIDs with minimal relief. She has not had any other interventions for the knee.  Location: medial and superior  Aggravators: prolonged sitting, prolonged standing, stairs, walking  Treatments tried: OTC NSAIDs (ibuprofen, Aleve, topical votaren gel)    Patient denies numbness/tingling distally.         Review of Systems Pertinent positives and negatives are documented in the HPI. All other systems reviewed are negative.   Medical History Past Medical History:   Diagnosis Date   ??? Abnormal TSH 11/27/2019   ??? Acquired hypothyroidism 11/27/2019   ??? Acute bilateral low back pain with sciatica    ??? Anemia    ??? Anxiety    ??? COPD (chronic obstructive pulmonary disease) (CMS-HCC)    ??? Dysthymia    ??? Encounter for supervision of normal pregnancy in multigravida 11/21/2012    FWB: Weekly BPPs starting @ 32-34 weeks for BMI>40. EFW: 2731 gms 6 lbs 0 oz (52%) at 34 1/7.  EFW: 3739 gms 8 lbs 4 oz (83%) at 37 6/7 GBS: neg MOD: Anticipate SVD. Feeding:  PP: LARC (IUD and*   ??? Graves disease    ??? Headache(784.0)    ??? Obesity    ??? OSA (obstructive sleep apnea)    ??? Sleep apnea    ??? Substance abuse (CMS-HCC)    ??? Type 2 diabetes mellitus without complication, without long-term current use of insulin (CMS-HCC) 11/27/2019      Surgical History Past Surgical History:   Procedure Laterality Date   ??? CHOLECYSTECTOMY        Allergies Patient has no known allergies.   Medications She has a current medication list which includes the following prescription(s): bupropion, empty container, fluticasone propionate, humira(cf) pen, humira(cf) pen [id number], hydrochlorothiazide, metformin, methimazole, methocarbamol, metoprolol succinate, naproxen, and proair hfa.  Family History Her family history includes Asthma in her brother and brother; COPD in her father and maternal grandfather; Diabetes in her maternal aunt, maternal aunt, maternal uncle, and maternal uncle; Hypertension in her mother; Osteoporosis in her maternal grandmother and mother; Stroke in her maternal uncle; Tremor in her mother.   Social History She reports that she has been smoking cigarettes. She has a 23.00 pack-year smoking history. She has never used smokeless tobacco. She reports previous alcohol use. She reports that she does not use drugs.Home [address]  [place] [address]        Occupational History   ??? Not on file     Social History     Socioeconomic History   ??? Marital status: Single   Tobacco Use   ??? Smoking status: Current Every Day Smoker     Packs/day: 1.00     Years: 23.00     Pack years: 23.00     Types: Cigarettes   ??? Smokeless tobacco: Never Used   Vaping Use   ??? Vaping Use: Never used   Substance and Sexual Activity   ??? Alcohol use: Not Currently     Comment: Never have been a heavy drinker   ??? Drug use: Never     Types: Marijuana   ??? Sexual activity: Not Currently     Partners: Male     Birth control/protection: Abstinence   Other Topics Concern   ??? Exercise No   ??? Living Situation No   ??? Do you use sunscreen? Yes   ??? Tanning bed use? Yes   ??? Are you easily burned? No   ??? Excessive sun exposure? Yes   ??? Blistering sunburns? Yes        OBJECTIVE:  DETAILED PHYSICAL EXAM (12 Point)  General Appearance ?? well-nourished, in no acute distress.   Mood and Affect ?? alert, cooperative and pleasant.   Pulmonary ?? No labored breathing or shortness of breath   Cardiovascular ?? well-perfused distally and no edema.   Lymphatics ?? No lymphadenopathy   Sensation ?? sensation to light touch distally normal    MUSCULOSKELETAL    Left Knee  Inspection/palpation  Range of motion  Stability  Strength  Skin ??? Inspection/palpation:  No swelling, erythema, deformity, atrophy or hypertrophy noted.  ??? Range of motion: Normal without limitations  ??? Stability:  No instability noted.  ??? Strength:  5/5 strength in all directions  ??? Skin:  Warm, dry, and intact   Right Knee  Inspection/palpation  Range of motion  Stability  Strength  Skin ??? Inspection: No swelling, erythema, or deformity  ?? Palpation: Tender: medial joint line   ??? Range of motion:  0-120   ??? Strength:  5/5 strength   ??? Special Tests:   ??? Negative McMurray's test   ??? Negative Lachman's test  ??? Negative Posterior Drawer test  ??? Negative Valgus stress test at 0 degrees  ??? Negative Valgus stress test at 30 degrees  ??? Negative Varus stress test  ??? Negative Patellar apprehension test  ??? Skin:  Warm, dry, and intact     Test Results  Imaging  Four views of the right knee were obtained today and independently reviewed by me and reveal moderate degenerative changes with joint space narrowing without osteophyte formation of the medial compartment of both knees.    Procedure  Injection: After discussing the risks and benefits of corticosteroid injection, informed consent was obtained and a timeout was  performed. The patient's right knee was prepped using chloraprep. Topical anesthesia was obtained using ethyl chloride spray, followed by injection using 1ml of 40 mg/mL of Kenalog, 4ml of 0.5% ropivacaine, and 0ml of 1% lidocaine through the anterolateral portal site.  The patient tolerated the injection well and understands what to expect over the next several days.     MEDICAL DECISION MAKING (level of service defined by 2/3 elements)     Number/Complexity of Problems Addressed 1 stable chronic illness (99203/99213)   Amount/Complexity of Data to be Reviewed/Analyzed Independent interpretation of a test performed by another physician/other qualified health care professional (99204/99214)   Risk of Complications/Morbidity/Mortality of Management Prescription Medication (99204/99214)

## 2021-05-27 NOTE — Unmapped (Signed)
Last PSG and CPAP titration 02/2020,Needs CPAP. Refill to sleep clinic

## 2021-05-27 NOTE — Unmapped (Signed)
Reported hx of COPD, hx of tobacco abuse.   -Refilled albuterol

## 2021-05-27 NOTE — Unmapped (Signed)
History of depression with anxiety.  Denies HI or SI.  Reports she stopped Prozac but wishes to restart Wellbutrin.  Refilled today.    GAD7 Total Score GAD-7 Total Score   05/13/2020 20   04/01/2020 21     PHQ-9 PHQ-9 TOTAL SCORE   04/28/2021 15   05/13/2020 18   04/01/2020 19

## 2021-05-27 NOTE — Unmapped (Signed)
Encouraged to follow back up with dermatology

## 2021-05-27 NOTE — Unmapped (Signed)
Right knee pain worse with stairs.  Suspect OA versus patellofemoral syndrome.  Referral to orthopedics for further eval

## 2021-05-27 NOTE — Unmapped (Signed)
Lost to follow-up with endo.  Labs as below.  Encouraged patient to follow-up with endocrinology

## 2021-05-27 NOTE — Unmapped (Signed)
Refilled Robaxin and naproxen

## 2021-05-27 NOTE — Unmapped (Signed)
Hx of AUB, Reports menses lasting ~19 days, passing clots   -Referral to ob/gyn for further eval

## 2021-05-27 NOTE — Unmapped (Signed)
Restart metformin.  Labs as below.

## 2021-05-31 NOTE — Unmapped (Signed)
[facility] Refill Coordination Note    Specialty Medication(s) to be Shipped:   Inflammatory Disorders: Humira    Other medication(s) to be shipped: No additional medications requested for fill at this time     Kelly May, DOB: [birth year]  Phone: [phone] (home)       All above HIPAA information was verified with patient.     Was a Nurse, learning disability used for this call? No    Completed refill call assessment today to schedule patient's medication shipment from the [facility] [phone]).  All relevant notes have been reviewed.     Specialty medication(s) and dose(s) confirmed: Regimen is correct and unchanged.   Changes to medications: Hydie reports no changes at this time.  Changes to insurance: No  New side effects reported not previously addressed with a pharmacist or physician: None reported  Questions for the pharmacist: No    Confirmed patient received a Conservation officer, historic buildings and a Surveyor, mining with first shipment. The patient will receive a drug information handout for each medication shipped and additional FDA Medication Guides as required.       DISEASE/MEDICATION-SPECIFIC INFORMATION        For patients on injectable medications: Patient currently has 1 doses left.  Next injection is scheduled for 06/04/2021.    SPECIALTY MEDICATION ADHERENCE     Medication Adherence    Patient reported X missed doses in the last month: 0  Specialty Medication: Humira Cf 40 mg/.4 ml  Patient is on additional specialty medications: No  Any gaps in refill history greater than 2 weeks in the last 3 months: no  Demonstrates understanding of importance of adherence: yes  Informant: patient  Reliability of informant: reliable  Confirmed plan for next specialty medication refill: delivery by pharmacy  Refills needed for supportive medications: not needed              Were doses missed due to medication being on hold? No    Humira CF 40/0.4 mg/ml: 7 days of medicine on hand REFERRAL TO PHARMACIST     Referral to the pharmacist: Not needed      [facility]     Shipping address confirmed in Epic.     Delivery Scheduled: Yes, Expected medication delivery date: 06/07/2021.     Medication will be delivered via UPS to the prescription address in Epic WAM.    Slade Pierpoint D Elba Schaber   [facility] Specialty Technician

## 2021-06-03 NOTE — Unmapped (Addendum)
Name:  Kelly May  DOB: 02-22-1981  Today's Date: 06/14/2021  Age:  40 y.o.    A/P:  Problem List Items Addressed This Visit        Endocrine    Type 2 diabetes mellitus without complication, without long-term current use of insulin (CMS-HCC) - Primary     Patient restarted metformin 500 mg daily 6.2.22 and patient is managing this well.   - Continue metformin as prescribed           Relevant Medications    liraglutide (VICTOZA 3-PAK) 0.6 mg/0.1 mL (18 mg/3 mL) injection    pen needle, diabetic 32 gauge x 1/4 (6 mm) Ndle    Graves disease     Patient followed by [facility] (Dr. Glean Hess). Grave's currently in remission with thyroid function test recently WNL. No longer taking methimazole              Genitourinary    Abnormal uterine bleeding (AUB)     Patient with history of AUB- suspect PCOS/anovulatory bleeding vs structural cause. Reports menses lasting 19 days, and is currently on the third week of this cycle. Patient reports passing clots. She is not currently using any hormonal birth control. Tried Depo (reports heavy bleeding still on Depo) as well as OCP in past  - Encouraged patient to schedule OB/GYN with number provided in AVS for further eval  - START Provera 10 mg three times daily for 10 days.    - Ordered TVUS for further eval  - START Iron supplement given suspect deficiency  - Ordered  labs as below . TSH previously WNL             Relevant Medications    medroxyPROGESTERone (PROVERA) 10 MG tablet    Other Relevant Orders    CBC w/ Differential (Completed)    Ferritin (Completed)    Iron & TIBC (Completed)    [place] Endovaginal (Non-OB)       Other    Morbid obesity (CMS-HCC)     Today???s BP: 134/80. Current BMI:  59.52.   - START Victoza 0.1 mL (0.6 mg total) under the skin daily for 7 days, THEN 0.2 mL (1.2 mg total) daily. Standard SE and precautions discussed  Wt Readings from Last 4 Encounters:   06/13/21 (!) 166 kg (366 lb)   05/03/21 (!) 168.9 kg (372 lb 6.4 oz)   04/28/21 (!) 167.4 kg (369 lb 1.9 oz)   07/20/20 (!) 161.1 kg (355 lb 3.2 oz)     Goal: To lose 5-10 pounds in 3 months from the present weight: (!) 166 kg (366 lb). Patient agrees to follow up with me closely for weight check/ lifestyle check.   Diet: Stop drinking soda, sweet tea, juice, and other sugary drinks (if any). Significantly decrease processed carbohydrates, desserts, or unhealthy snacks. Advised that dietician can help make appropriate manageable recommendations.  Referrals - none today.  Exercise: Advised to exercise at least 30 minutes per day (walking, treadmill, bicycling, swimming, gym). Advised Pt to start exercising 10 minutes 3 times per week and increase by 5 minutes every 2 weeks until exercising 30 minutes 3 times per week, then add 1 day.    Glaucoma:   Seizures:   Medullary thyroid cancer (personal or FHx): no  Multiple Endocrine Neoplasia: no  Palpitations/Tachycardia:   Chest Pain:   Headaches/Migraines:   Nephrolithiasis:   H/o pancreatitis: no  GERD: no  Relevant Medications    liraglutide (VICTOZA 3-PAK) 0.6 mg/0.1 mL (18 mg/3 mL) injection    Peripheral edema     Symptoms stable as patient manages with hydrochlorothiazide 12.5 PRN. Patient reports her feet have not been swollen recently.           Mixed anxiety and depressive disorder     Hx and PHQ9 c/w with MDD and anxiety. No SI or HI.  Patient noticed slight improvement when starting Wellbutrin.  - INCREASE Wellbutrin XL 150 --> 300 mg every day  - referral to social work to see if any community resources available for power bill given financial difficulties  - Recheck in 6 weeks or sooner if needed    PHQ-9 PHQ-9 TOTAL SCORE   06/13/2021 15   04/28/2021 15   05/13/2020 18   04/01/2020 19     GAD7 Total Score GAD-7 Total Score   06/13/2021 14   05/13/2020 20   04/01/2020 21                Relevant Medications    buPROPion (WELLBUTRIN XL) 300 MG 24 hr tablet    Dizziness     Patient endorses feeling dizzy, and fell x 1. Suspect 2/2 AUB and anemia. See AUB tab  - Checked orthostatics today- WNL           RESOLVED: Anxiety      Other Visit Diagnoses     Financial difficulties        Relevant Orders    Ambulatory referral to Social Work          Medication adherence and barriers to the treatment plan have been addressed. Opportunities to optimize healthy behaviors have been discussed. Patient / caregiver voiced understanding.      No follow-ups on file.    S:  Kelly May is a 40 y.o. female who presents for follow up     Type 2 diabetes  Restarted metformin 500 mg daily at last OV 04/28/21, patient adheres most days.    Grave's Disease  Patient followed by [facility] endocrinology - Dr. Conley Canal in remission based on recent labs. Pt advised to monitor for symptoms of recurrence and follow up with endo. Endo also pursing [place] eval pending given pale striae, obesity, dorsal cervical adiposity and hirsutism in setting of suspected  PCOS given menstrual irregularities. NM salivary x 3 and 24 hr UFC ordered for further eval. Pt has not completed this yet.    Peripheral edema  Using Hydrochlorothiazide 12.5 mg PRN. She reports her feet haven't been swollen lately     MDD/anxiety  Stopped prozac, started wellbutrin 150 mg at last OV. Pt feels dep is more controlled. Anxiety is worse per patient in setting of financial insecurity.     AUB  Patient has been on period for 3 weeks, and patient previously had cycle lasting 19 days. Plan to follow with OB/GYN but she hasn't heard back yet. She will plan to call for follow up. She reports with 2-3 super plus tampons daily at present. When flow is heavy, patient uses Depends and would change a few times daily. Patient has tried depo shot and OCPs in past. Recalls heavy bleeding in past with Depo. Additional patient reports she fell at home  2/2 to recent dizziness. Denies significant SOB.    Obesity  Patient wishes to start medication to aid in weight loss.    ROS:  A 12 point review of systems was negative  except for pertinent items noted in the HPI.    Past medical history, family history, surgical history and social history personally reviewed and verified with patient.     PROBLEM LIST  Patient Active Problem List   Diagnosis   ??? OSA (obstructive sleep apnea)   ??? Morbid obesity (CMS-HCC)   ??? Headache, acute   ??? Postpartum care following vaginal delivery   ??? Annual physical exam   ??? SOB (shortness of breath)   ??? Fatigue   ??? Peripheral edema   ??? Chronic low back pain without sciatica   ??? Vitamin D deficiency   ??? Type 2 diabetes mellitus without complication, without long-term current use of insulin (CMS-HCC)   ??? Acquired hypothyroidism   ??? Acute bilateral low back pain with sciatica   ??? Graves disease   ??? Routine screening for STI (sexually transmitted infection)   ??? Mixed anxiety and depressive disorder   ??? Hidradenitis suppurativa   ??? Tobacco abuse   ??? Rectal bleeding   ??? Acute pain of right knee   ??? Abnormal uterine bleeding (AUB)   ??? Dizziness       O:  Vitals:    06/13/21 1408   BP: 134/80   Pulse: 102   Temp: 36.7 ??C (98 ??F)     General:  Well appearing, well nourished in no distress.   Skin: no obvious rash or  prominent lesions    Lymphatic: no cervical LAD. No TM  Cardiovascular:  rrr no mrg, no peripheral edema  Eyes:  conjunctiva clear, sclera non-icteric   Ears/nose/throat: no obvious external deformities  Respiratory: CTAB, breathing comfortably  Musculoskeletal:  Normal gait and station.  Neurologic: moves extremities symmetrically, cranial nerves grossly intact  Hematologic: no obvious ecchymosis     Psychiatric:  Oriented X3, intact judgement and insight, normal mood and affect.    I attest that I, Katheran Awe Rindoks, personally documented this note while acting as scribe for [employer], PAC.      Cassidy E Rindoks, Scribe.  06/13/2021     The documentation recorded by the scribe accurately reflects the service I personally performed and the decisions made by me.    Tynesia Harral A Ilena Dieckman, PAC

## 2021-06-06 MED FILL — HUMIRA PEN CITRATE FREE 40 MG/0.4 ML: SUBCUTANEOUS | 28 days supply | Qty: 4 | Fill #3

## 2021-06-13 ENCOUNTER — Ambulatory Visit: Admit: 2021-06-13 | Discharge: 2021-06-14 | Payer: PRIVATE HEALTH INSURANCE | Attending: Medical | Primary: Medical

## 2021-06-13 DIAGNOSIS — F419 Anxiety disorder, unspecified: Principal | ICD-10-CM

## 2021-06-13 DIAGNOSIS — N939 Abnormal uterine and vaginal bleeding, unspecified: Principal | ICD-10-CM

## 2021-06-13 DIAGNOSIS — Z599 Problem related to housing and economic circumstances, unspecified: Principal | ICD-10-CM

## 2021-06-13 DIAGNOSIS — R42 Dizziness and giddiness: Principal | ICD-10-CM

## 2021-06-13 DIAGNOSIS — E0500 Thyrotoxicosis with diffuse goiter without thyrotoxic crisis or storm: Principal | ICD-10-CM

## 2021-06-13 DIAGNOSIS — F418 Other specified anxiety disorders: Principal | ICD-10-CM

## 2021-06-13 DIAGNOSIS — R609 Edema, unspecified: Principal | ICD-10-CM

## 2021-06-13 DIAGNOSIS — E119 Type 2 diabetes mellitus without complications: Principal | ICD-10-CM

## 2021-06-13 MED ORDER — PEN NEEDLE, DIABETIC 32 GAUGE X 1/4" (6 MM)
Freq: Every day | SUBCUTANEOUS | 3 refills | 0.00000 days | Status: CP
Start: 2021-06-13 — End: 2022-06-13

## 2021-06-13 MED ORDER — BUPROPION HCL XL 300 MG 24 HR TABLET, EXTENDED RELEASE
ORAL_TABLET | Freq: Every morning | ORAL | 3 refills | 90.00000 days | Status: CP
Start: 2021-06-13 — End: 2022-06-13

## 2021-06-13 MED ORDER — MEDROXYPROGESTERONE 10 MG TABLET
ORAL_TABLET | Freq: Three times a day (TID) | ORAL | 0 refills | 10.00000 days | Status: CP
Start: 2021-06-13 — End: 2021-06-23

## 2021-06-13 MED ORDER — FERROUS FUMARATE 324 MG (106 MG IRON) TABLET
ORAL_TABLET | Freq: Every day | ORAL | 1 refills | 90.00000 days | Status: CP
Start: 2021-06-13 — End: ?

## 2021-06-13 MED ORDER — VICTOZA 3-PAK 0.6 MG/0.1 ML (18 MG/3 ML) SUBCUTANEOUS PEN INJECTOR
SUBCUTANEOUS | 1 refills | 90.00000 days | Status: CP
Start: 2021-06-13 — End: 2021-09-11

## 2021-06-13 NOTE — Unmapped (Addendum)
Patient restarted metformin 500 mg daily 6.2.22 and patient is managing this well.   - Continue metformin as prescribed

## 2021-06-13 NOTE — Unmapped (Addendum)
Patient followed by [facility] Endocrinology (Dr. Glean Hess). Grave's currently in remission with thyroid function test recently WNL. No longer taking methimazole

## 2021-06-13 NOTE — Unmapped (Addendum)
Patient with history of AUB- suspect PCOS/anovulatory bleeding vs structural cause. Reports menses lasting 19 days, and is currently on the third week of this cycle. Patient reports passing clots. She is not currently using any hormonal birth control. Tried Depo (reports heavy bleeding still on Depo) as well as OCP in past  - Encouraged patient to schedule OB/GYN with number provided in AVS for further eval  - START Provera 10 mg three times daily for 10 days.    - Ordered TVUS for further eval  - START Iron supplement given suspect deficiency  - Ordered  labs as below . TSH previously WNL

## 2021-06-13 NOTE — Unmapped (Signed)
Symptoms stable as patient manages with hydrochlorothiazide 12.5 PRN. Patient reports her feet have not been swollen recently.

## 2021-06-13 NOTE — Unmapped (Addendum)
[facility] Radiology Imaging Scheduling-DEXA/bone density scan, MRI, CT, ultrasound [phone]    [facility]  [phone]

## 2021-06-13 NOTE — Unmapped (Addendum)
Patient endorses feeling dizzy, and fell x 1. Suspect 2/2 AUB and anemia. See AUB tab  - Checked orthostatics today- WNL

## 2021-06-13 NOTE — Unmapped (Deleted)
See MDD tab

## 2021-06-13 NOTE — Unmapped (Addendum)
Today???s BP: 134/80. Current BMI:  59.52.   - START Victoza 0.1 mL (0.6 mg total) under the skin daily for 7 days, THEN 0.2 mL (1.2 mg total) daily. Standard SE and precautions discussed  Wt Readings from Last 4 Encounters:   06/13/21 (!) 166 kg (366 lb)   05/03/21 (!) 168.9 kg (372 lb 6.4 oz)   04/28/21 (!) 167.4 kg (369 lb 1.9 oz)   07/20/20 (!) 161.1 kg (355 lb 3.2 oz)     Goal: To lose 5-10 pounds in 3 months from the present weight: (!) 166 kg (366 lb). Patient agrees to follow up with me closely for weight check/ lifestyle check.   Diet: Stop drinking soda, sweet tea, juice, and other sugary drinks (if any). Significantly decrease processed carbohydrates, desserts, or unhealthy snacks. Advised that dietician can help make appropriate manageable recommendations.  Referrals - none today.  Exercise: Advised to exercise at least 30 minutes per day (walking, treadmill, bicycling, swimming, gym). Advised Pt to start exercising 10 minutes 3 times per week and increase by 5 minutes every 2 weeks until exercising 30 minutes 3 times per week, then add 1 day.    Glaucoma:   Seizures:   Medullary thyroid cancer (personal or FHx): no  Multiple Endocrine Neoplasia: no  Palpitations/Tachycardia:   Chest Pain:   Headaches/Migraines:   Nephrolithiasis:   H/o pancreatitis: no  GERD: no

## 2021-06-13 NOTE — Unmapped (Addendum)
Hx and PHQ9 c/w with MDD and anxiety. No SI or HI.  Patient noticed slight improvement when starting Wellbutrin.  - INCREASE Wellbutrin XL 150 --> 300 mg every day  - referral to social work to see if any community resources available for power bill given financial difficulties  - Recheck in 6 weeks or sooner if needed    PHQ-9 PHQ-9 TOTAL SCORE   06/13/2021 15   04/28/2021 15   05/13/2020 18   04/01/2020 19     GAD7 Total Score GAD-7 Total Score   06/13/2021 14   05/13/2020 20   04/01/2020 21

## 2021-06-14 NOTE — Unmapped (Signed)
Addended by: Sonny Dandy A on: 06/14/2021 12:00 AM     Modules accepted: Orders

## 2021-06-17 DIAGNOSIS — L732 Hidradenitis suppurativa: Principal | ICD-10-CM

## 2021-06-17 DIAGNOSIS — R945 Abnormal results of liver function studies: Principal | ICD-10-CM

## 2021-06-17 NOTE — Unmapped (Signed)
Refill request for Humira Pen 40 mg/0.4 ML  Patient last seen in clinic 10/27/20  Refill sent for your review

## 2021-06-20 MED ORDER — HUMIRA PEN CITRATE FREE 40 MG/0.4 ML
SUBCUTANEOUS | 3 refills | 28.00000 days | Status: CP
Start: 2021-06-20 — End: ?
  Filled 2021-07-06: qty 4, 28d supply, fill #0

## 2021-06-24 ENCOUNTER — Encounter: Admit: 2021-06-24 | Discharge: 2021-06-25 | Payer: PRIVATE HEALTH INSURANCE

## 2021-06-24 DIAGNOSIS — Z599 Problem related to housing and economic circumstances, unspecified: Principal | ICD-10-CM

## 2021-06-24 NOTE — Unmapped (Signed)
Telephone call to patient to provide resources for utility payment assistance. Patient states the electric bill is in her ex's name so any application for assistance would need to be done by him. The following information was provided, and letter sent to patient with the same information as she does not have access to her mychart.       Kelly May Dept of Social Services  [place] [employer] of Social Services offers a helping hand through our public assistance services.?? Our Museum/gallery curator division partners with individuals and families to achieve success, permanence, and safety. These departments enable families to become self-sufficient, assist families during a crisis and/or enable families to work and meet their basic needs.?? The following are available to help families with their needs:   Crisis Intervention Program (CIP): Offers financial help with utility bills for heating or cooling emergencies. Payments are made to the vendor. Help is available more than once every 12 months.   [phone] (Main)   ??  [email or web address]://[email or web address]/dss/programs-[email or web address]-services/economic_services/  ??  ??  Medical illustrator  [employer] [place] provides utility assistance to those in need. To receive assistance, you will need to provide a disconnection notice and an ID. For questions or to make an appointment, please call [phone].  ??  [employer]  ??  ??  [facility]   Offers financial help with rent and utilities for renters who have been financially impacted by COVID.   ?? [phone] (Main)  ?? http://[email or web address]/ complete the form online at the bottom of the page to apply

## 2021-07-05 NOTE — Unmapped (Addendum)
[facility] Refill Coordination Note    Specialty Medication(s) to be Shipped:   Inflammatory Disorders: Humira    Other medication(s) to be shipped: No additional medications requested for fill at this time     Kelly May, DOB: 01-Jan-1981  Phone: [phone] (home)       All above HIPAA information was verified with patient.     Was a Nurse, learning disability used for this call? No    Completed refill call assessment today to schedule patient's medication shipment from the [facility] [phone]).  All relevant notes have been reviewed.     Specialty medication(s) and dose(s) confirmed: Regimen is correct and unchanged.   Changes to medications: Kelly May reports starting the following medications: VICTOZA 3-PAK 0.6 mg/0.1 mL (18 mg/3 mL) injection (liraglutide). Take 1.2mg  daily  Changes to insurance: No  New side effects reported not previously addressed with a pharmacist or physician: None reported  Questions for the pharmacist: No    Confirmed patient received a Conservation officer, historic buildings and a Surveyor, mining with first shipment. The patient will receive a drug information handout for each medication shipped and additional FDA Medication Guides as required.       DISEASE/MEDICATION-SPECIFIC INFORMATION        For patients on injectable medications: Patient currently has 0 doses left.  Next injection is scheduled for 07/09/2021.    SPECIALTY MEDICATION ADHERENCE     Medication Adherence    Patient reported X missed doses in the last month: 0  Specialty Medication: Humira Cf 40 mg/.4 ml  Patient is on additional specialty medications: No  Any gaps in refill history greater than 2 weeks in the last 3 months: no  Demonstrates understanding of importance of adherence: yes  Informant: patient  Reliability of informant: reliable  Confirmed plan for next specialty medication refill: delivery by pharmacy  Refills needed for supportive medications: not needed              Were doses missed due to medication being on hold? No    Humira CF 40/0.4 mg/ml: 0 days of medicine on hand       REFERRAL TO PHARMACIST     Referral to the pharmacist: Not needed      [facility]     Shipping address confirmed in Epic.     Delivery Scheduled: Yes, Expected medication delivery date: 07/07/2021.     Medication will be delivered via UPS to the prescription address in Epic WAM.    Kelly May Kelly May   [facility] Specialty Technician

## 2021-07-08 NOTE — Unmapped (Signed)
Name:  Kelly May  DOB: 06/08/1981  Today's Date: 07/19/2021  Age:  40 y.o.    A/P:  Problem List Items Addressed This Visit        Genitourinary    Abnormal uterine bleeding (AUB) - Primary     Patient with history of AUB- suspect PCOS/anovulatory bleeding vs structural cause. Reports last menses  was 05/23/21 --> 06/26/21 and was heavy the whole time. She is not currently using any hormonal birth control. Patient was unable to fill Provera 10 mg three times daily in time for this to be beneficial. She has been taking iron supplement of unknown dose. Tried Depo (reports heavy bleeding still on Depo) as well as OCP in past.   - Advised patient continue iron supplement  - Recommended patient trial Provera 10 mg three times daily if bleeding is heavy for next cycle.   - START COC as indicated in birth control counseling tab  - Consider follow up appointment with hematology for iron infusion pending today's labs  - Messaging referral coordinator RE OB/GYN appt  - Rechecked labs as below  - TVUS scheduled 08/18/21, encouraged patient to keep this apt and call to see if she can get sooner appt    06/13/21 Iron: <20, ferritin 6.4  Lab Results   Component Value Date    HGB 10.6 (L) 07/19/2021    HGB 9.2 (L) 06/13/2021    HGB 13.5 07/20/2020    HCT 34.9 07/19/2021    HCT 29.5 (L) 06/13/2021    HCT 41.7 07/20/2020              Relevant Medications    norgestimate-ethinyl estradioL (ORTHO-CYCLEN) 0.25-35 mg-mcg per tablet    Other Relevant Orders    CBC w/ Differential (Completed)    Iron & TIBC (Completed)    Ferritin (Completed)       Other    Mixed anxiety and depressive disorder     Hx and PHQ9 c/w with MDD and anxiety. No SI or HI.  Patient noticed slight improvement when starting Wellbutrin. Patient unable to pick up higher dose prescription 2/2 insurance   - Resent Wellbutrin XL 300 mg every day to pt's new preferred pharmacy   - Recheck in 6 weeks or sooner if needed    PHQ-9 PHQ-9 TOTAL SCORE   07/19/2021 13 06/13/2021 15   04/28/2021 15   05/13/2020 18   04/01/2020 19     GAD7 Total Score GAD-7 Total Score   06/13/2021 14   05/13/2020 20   04/01/2020 21              Relevant Medications    buPROPion (WELLBUTRIN XL) 300 MG 24 hr tablet    Dizziness     Patient endorses intermittent feeling dizzy, and a few episodes of presyncope. Suspect 2/2 IDA. - See AUB tab  - Ordered repeat labs today          Relevant Orders    Basic Metabolic Panel (Completed)    Urinary urgency     Patient endorses urinary urgency, frequency, and episodes of urge and stress incontinence for a while. Symptoms progressively worsening and becoming more frequent.   - Ordered urinalysis with culture reflex today  - Plan referral to urology for further work up         Relevant Orders    Urinalysis with Culture Reflex (Completed)    Urine Culture (Completed)    Birth control counseling      Other  Visit Diagnoses     Abnormal LFTs        Relevant Orders    Hepatic Function Panel (Completed)          Medication adherence and barriers to the treatment plan have been addressed. Opportunities to optimize healthy behaviors have been discussed. Patient / caregiver voiced understanding.      Return in about 1 month (around 08/19/2021) for Recheck AUB.    S:  Kelly May is a 40 y.o. female who presents for follow up     AUB  Patient wasn't able to start Provera 10 mg three times daily for 10 days since last OV because she couldn't get it filled until after her period ended (05/23/21 --> 06/26/21) and no breakthrough bleeding since then. She has not picked it up. She was also advised to start iron supplement bc patient is anemic following last labs 06/13/21. Patient has not yet followed with OBGYN or completed US endovaginal. She continues to complain of dizziness. She has been taking an iron supplement of unknown dose and has tolerated this well. Pt has used COC in past. No known contraindications. Father may have history of blood clot, but without known clotting disorder and no personal history of blood clots. No history of migraine headaches with aura. No known liver disease other than NAFLD, no gallbladder disease. No family history of breast CA.     Urinary incontinence   Patient endorses urinary incontinence, urinary urgency and frequency x a while. Symptoms progressively worsened, no longer occasional symptoms. She has had to change clothes following incontinence episode. Denies hematuria, dysuria.     Dizziness   Patient reports dizziness has not improved. Patient continues to complain of AUB and dizziness. A few episodes of almost passing out per patient. Menses has stopped since last OV and     Anxiety and depression  Wellbutrin 300 mg daily prescribed at last OV. Followed with social work 06/24/21. She has not gotten Wellbutrin 300 mg daily yet as insurance has been a challenge.      Morbid obesity   Victoza  0.2 mL Daily started at last OV. She is currently adhering to Victoza 1.2 mL daily and is tolerating this well but has not lost weight.   Patient requested handicap sticker for car as she feels out of breath walking from her car to the store.     ROS:  A 12 point review of systems was negative except for pertinent items noted in the HPI.    Past medical history, family history, surgical history and social history personally reviewed and verified with patient.     PROBLEM LIST  Patient Active Problem List   Diagnosis   ??? OSA (obstructive sleep apnea)   ??? Morbid obesity (CMS-HCC)   ??? Headache, acute   ??? Postpartum care following vaginal delivery   ??? Annual physical exam   ??? SOB (shortness of breath)   ??? Fatigue   ??? Peripheral edema   ??? Chronic low back pain without sciatica   ??? Vitamin D deficiency   ??? Type 2 diabetes mellitus without complication, without long-term current use of insulin (CMS-HCC)   ??? Acquired hypothyroidism   ??? Acute bilateral low back pain with sciatica   ??? Graves disease   ??? Routine screening for STI (sexually transmitted infection)   ??? Mixed anxiety and depressive disorder   ??? Hidradenitis suppurativa   ??? Tobacco abuse   ??? Rectal bleeding   ??? Acute pain of  right knee   ??? Abnormal uterine bleeding (AUB)   ??? Dizziness   ??? Urinary urgency   ??? Birth control counseling       O:  Vitals:    07/19/21 1356   BP: 130/79   Pulse: 98   SpO2: 97%     General:  Well appearing, well nourished in no distress.   Skin: no obvious rash or  prominent lesions    Lymphatic: no cervical LAD. No TM  Cardiovascular:  rrr no mrg, no peripheral edema  Eyes:  conjunctiva clear, sclera non-icteric   Ears/nose/throat: no obvious external deformities  Respiratory: CTAB, breathing comfortably  Gastrointestinal: soft NTND no masses  Musculoskeletal:  Normal gait and station.  Neurologic: moves extremities symmetrically, cranial nerves grossly intact  Hematologic: no obvious ecchymosis     Psychiatric:  Oriented X3, intact judgement and insight, normal mood and affect.    I attest that I, Katheran Awe Rindoks, personally documented this note while acting as scribe for [employer], PAC.      Cassidy E Rindoks, Scribe.  07/19/2021     The documentation recorded by the scribe accurately reflects the service I personally performed and the decisions made by me.    Lekeisha Arenas A Braven Wolk, PAC

## 2021-07-19 ENCOUNTER — Ambulatory Visit: Admit: 2021-07-19 | Discharge: 2021-07-20 | Payer: PRIVATE HEALTH INSURANCE | Attending: Medical | Primary: Medical

## 2021-07-19 DIAGNOSIS — Z3009 Encounter for other general counseling and advice on contraception: Principal | ICD-10-CM

## 2021-07-19 DIAGNOSIS — R42 Dizziness and giddiness: Principal | ICD-10-CM

## 2021-07-19 DIAGNOSIS — R945 Abnormal results of liver function studies: Principal | ICD-10-CM

## 2021-07-19 DIAGNOSIS — N939 Abnormal uterine and vaginal bleeding, unspecified: Principal | ICD-10-CM

## 2021-07-19 DIAGNOSIS — F418 Other specified anxiety disorders: Principal | ICD-10-CM

## 2021-07-19 DIAGNOSIS — R3915 Urgency of urination: Principal | ICD-10-CM

## 2021-07-19 LAB — CBC W/ AUTO DIFF
BASOPHILS ABSOLUTE COUNT: 0.2 10*9/L — ABNORMAL HIGH (ref 0.0–0.1)
BASOPHILS RELATIVE PERCENT: 1.5 %
EOSINOPHILS ABSOLUTE COUNT: 0.4 10*9/L (ref 0.0–0.5)
EOSINOPHILS RELATIVE PERCENT: 3.2 %
HEMATOCRIT: 34.9 % (ref 34.0–44.0)
HEMOGLOBIN: 10.6 g/dL — ABNORMAL LOW (ref 11.3–14.9)
LYMPHOCYTES ABSOLUTE COUNT: 3.6 10*9/L (ref 1.1–3.6)
LYMPHOCYTES RELATIVE PERCENT: 31.8 %
MEAN CORPUSCULAR HEMOGLOBIN CONC: 30.4 g/dL — ABNORMAL LOW (ref 32.0–36.0)
MEAN CORPUSCULAR HEMOGLOBIN: 22.3 pg — ABNORMAL LOW (ref 25.9–32.4)
MEAN CORPUSCULAR VOLUME: 73.5 fL — ABNORMAL LOW (ref 77.6–95.7)
MEAN PLATELET VOLUME: 8.6 fL (ref 6.8–10.7)
MONOCYTES ABSOLUTE COUNT: 0.7 10*9/L (ref 0.3–0.8)
MONOCYTES RELATIVE PERCENT: 6.4 %
NEUTROPHILS ABSOLUTE COUNT: 6.5 10*9/L (ref 1.8–7.8)
NEUTROPHILS RELATIVE PERCENT: 57.1 %
NUCLEATED RED BLOOD CELLS: 0 /100{WBCs} (ref <=4)
PLATELET COUNT: 311 10*9/L (ref 150–450)
RED BLOOD CELL COUNT: 4.75 10*12/L (ref 3.95–5.13)
RED CELL DISTRIBUTION WIDTH: 18.9 % — ABNORMAL HIGH (ref 12.2–15.2)
WBC ADJUSTED: 11.4 10*9/L — ABNORMAL HIGH (ref 3.6–11.2)

## 2021-07-19 LAB — BASIC METABOLIC PANEL
ANION GAP: 8 mmol/L (ref 5–14)
BLOOD UREA NITROGEN: 10 mg/dL (ref 9–23)
BUN / CREAT RATIO: 13
CALCIUM: 9.5 mg/dL (ref 8.7–10.4)
CHLORIDE: 102 mmol/L (ref 98–107)
CO2: 26.9 mmol/L (ref 20.0–31.0)
CREATININE: 0.77 mg/dL
EGFR CKD-EPI (2021) FEMALE: >90 mL/min/{1.73_m2} (ref >=60)
GLUCOSE RANDOM: 105 mg/dL (ref 70–179)
POTASSIUM: 4.1 mmol/L (ref 3.4–4.8)
SODIUM: 137 mmol/L (ref 135–145)

## 2021-07-19 LAB — URINALYSIS WITH CULTURE REFLEX
BILIRUBIN UA: NEGATIVE
GLUCOSE UA: NEGATIVE
KETONES UA: NEGATIVE
LEUKOCYTE ESTERASE UA: NEGATIVE
NITRITE UA: NEGATIVE
PH UA: 7 (ref 5.0–9.0)
PROTEIN UA: NEGATIVE
RBC UA: 2 /HPF (ref <4)
SPECIFIC GRAVITY UA: 1.025 (ref 1.005–1.040)
SQUAMOUS EPITHELIAL: 2 /HPF (ref 0–5)
UROBILINOGEN UA: 0.2
WBC UA: <1 /HPF (ref 0–5)

## 2021-07-19 LAB — IRON & TIBC
IRON: <2 ug/dL — ABNORMAL LOW
TOTAL IRON BINDING CAPACITY (CALC): 389.3 mg/dL
TRANSFERRIN: 309 mg/dL

## 2021-07-19 LAB — HEPATIC FUNCTION PANEL
ALBUMIN: 3.2 g/dL — ABNORMAL LOW (ref 3.4–5.0)
ALKALINE PHOSPHATASE: 107 U/L (ref 46–116)
ALT (SGPT): 90 U/L — ABNORMAL HIGH (ref 10–49)
AST (SGOT): 73 U/L — ABNORMAL HIGH (ref <=34)
BILIRUBIN DIRECT: <0.1 mg/dL (ref 0.00–0.30)
BILIRUBIN TOTAL: 0.3 mg/dL (ref 0.3–1.2)
PROTEIN TOTAL: 8.1 g/dL (ref 5.7–8.2)

## 2021-07-19 LAB — FERRITIN: FERRITIN: 7.9 ng/mL

## 2021-07-19 MED ORDER — BUPROPION HCL XL 300 MG 24 HR TABLET, EXTENDED RELEASE
ORAL_TABLET | Freq: Every morning | ORAL | 3 refills | 90 days | Status: CP
Start: 2021-07-19 — End: 2022-07-19

## 2021-07-19 NOTE — Unmapped (Addendum)
Contraceptive consultation in healthy female, for AUB and no contraindications to starting medication.    PLAN:   - Contraceptive options discussed including OCP, Nexplanon, Nuvaring, Ortho-Evra, Depo-Provera, and IUD. Pt elects trial of med below, R/U/SE reviewed. Patient hesitant to try pills as she is bad at taking pills. She is open to Depo shots, (previously experienced heavy bleeding still)  - Counseled on benefits, risks and common side effects of OCPs, including bleeding, spotting. Reviewed potential serious side effects of OCPs, including risk for blood clots. Discussed symptoms which might occur and importance of immediate medical care if she experiences these.   - Discussed importance of good compliance and strategies for remembering to take a medication on a daily basis; and what to do if she misses one or more pills.  - Health Maintenance guidelines reviewed. Encouraged safe sex practices and condom use to prevent STDs including HIV.  - F/U in 3-4 months for BP check and contraceptive evaluation,  pt voices understanding regarding care plan.   - Father may have history of blood clot, no personal history of blood clots. No history of migraine headaches with aura. No known liver disease. No family history of breast CA.     Requested Prescriptions     Signed Prescriptions Disp Refills   ??? buPROPion (WELLBUTRIN XL) 300 MG 24 hr tablet 90 tablet 3     Sig: Take 1 tablet (300 mg total) by mouth every morning.     This plan was discussed with the patient and questions were answered. There were no further concerns.  Follow up as indicated, or sooner should any new problems arise, if conditions worsen, or if they are otherwise concerned.

## 2021-07-19 NOTE — Unmapped (Addendum)
Patient with history of AUB- suspect PCOS/anovulatory bleeding vs structural cause. Reports last menses  was 05/23/21 --> 06/26/21 and was heavy the whole time. She is not currently using any hormonal birth control. Patient was unable to fill Provera 10 mg three times daily in time for this to be beneficial. She has been taking iron supplement of unknown dose. Tried Depo (reports heavy bleeding still on Depo) as well as OCP in past.   - Advised patient continue iron supplement  - Recommended patient trial Provera 10 mg three times daily if bleeding is heavy for next cycle.   - START COC as indicated in birth control counseling tab  - Consider follow up appointment with hematology for iron infusion pending today's labs  - Messaging referral coordinator RE OB/GYN appt  - Rechecked labs as below  - TVUS scheduled 08/18/21, encouraged patient to keep this apt and call to see if she can get sooner appt    06/13/21 Iron: <20, ferritin 6.4  Lab Results   Component Value Date    HGB 10.6 (L) 07/19/2021    HGB 9.2 (L) 06/13/2021    HGB 13.5 07/20/2020    HCT 34.9 07/19/2021    HCT 29.5 (L) 06/13/2021    HCT 41.7 07/20/2020

## 2021-07-19 NOTE — Unmapped (Addendum)
Patient endorses intermittent feeling dizzy, and a few episodes of presyncope. Suspect 2/2 IDA. - See AUB tab  - Ordered repeat labs today

## 2021-07-19 NOTE — Unmapped (Addendum)
Hx and PHQ9 c/w with MDD and anxiety. No SI or HI.  Patient noticed slight improvement when starting Wellbutrin. Patient unable to pick up higher dose prescription 2/2 insurance   - Resent Wellbutrin XL 300 mg every day to pt's new preferred pharmacy   - Recheck in 6 weeks or sooner if needed    PHQ-9 PHQ-9 TOTAL SCORE   07/19/2021 13   06/13/2021 15   04/28/2021 15   05/13/2020 18   04/01/2020 19     GAD7 Total Score GAD-7 Total Score   06/13/2021 14   05/13/2020 20   04/01/2020 21

## 2021-07-19 NOTE — Unmapped (Addendum)
Patient endorses urinary urgency, frequency, and episodes of urge and stress incontinence for a while. Symptoms progressively worsening and becoming more frequent.   - Ordered urinalysis with culture reflex today  - Plan referral to urology for further work up

## 2021-07-19 NOTE — Unmapped (Signed)
Call this number to ask for sooner ultrasound appt - [phone]

## 2021-07-25 MED ORDER — NORGESTIMATE 0.25 MG-ETHINYL ESTRADIOL 35 MCG TABLET
ORAL_TABLET | Freq: Every day | ORAL | 3 refills | 84.00000 days | Status: CP
Start: 2021-07-25 — End: 2022-07-25

## 2021-07-27 NOTE — Unmapped (Signed)
[facility] Refill Coordination Note    Specialty Medication(s) to be Shipped:   Inflammatory Disorders: Humira    Other medication(s) to be shipped: No additional medications requested for fill at this time     Kelly May, DOB: 08-10-1981  Phone: [phone] (home)       All above HIPAA information was verified with patient.     Was a Nurse, learning disability used for this call? No    Completed refill call assessment today to schedule patient's medication shipment from the [facility] [phone]).  All relevant notes have been reviewed.     Specialty medication(s) and dose(s) confirmed: Regimen is correct and unchanged.   Changes to medications: Jaidalyn reports no changes at this time.  Changes to insurance: No  New side effects reported not previously addressed with a pharmacist or physician: None reported  Questions for the pharmacist: No    Confirmed patient received a Conservation officer, historic buildings and a Surveyor, mining with first shipment. The patient will receive a drug information handout for each medication shipped and additional FDA Medication Guides as required.       DISEASE/MEDICATION-SPECIFIC INFORMATION        For patients on injectable medications: Patient currently has 1 doses left.  Next injection is scheduled for 07/30/2021.    SPECIALTY MEDICATION ADHERENCE     Medication Adherence    Patient reported X missed doses in the last month: 0  Specialty Medication: Humira Cf 40 mg/.4 ml  Patient is on additional specialty medications: No  Any gaps in refill history greater than 2 weeks in the last 3 months: no  Demonstrates understanding of importance of adherence: yes  Informant: patient  Reliability of informant: reliable  Confirmed plan for next specialty medication refill: delivery by pharmacy  Refills needed for supportive medications: not needed              Were doses missed due to medication being on hold? No    Humira CF 40/0.4 mg/ml: 7 days of medicine on hand REFERRAL TO PHARMACIST     Referral to the pharmacist: Not needed      [facility]     Shipping address confirmed in Epic.     Delivery Scheduled: Yes, Expected medication delivery date: 08/03/2021.     Medication will be delivered via UPS to the prescription address in Epic WAM.    Shalin Vonbargen D Bookert Guzzi   [facility] Specialty Technician

## 2021-07-28 DIAGNOSIS — D500 Iron deficiency anemia secondary to blood loss (chronic): Principal | ICD-10-CM

## 2021-08-02 MED FILL — HUMIRA PEN CITRATE FREE 40 MG/0.4 ML: SUBCUTANEOUS | 28 days supply | Qty: 4 | Fill #1

## 2021-08-10 ENCOUNTER — Ambulatory Visit
Admit: 2021-08-10 | Discharge: 2021-08-11 | Payer: PRIVATE HEALTH INSURANCE | Attending: Dermatology | Primary: Dermatology

## 2021-08-10 DIAGNOSIS — L732 Hidradenitis suppurativa: Principal | ICD-10-CM

## 2021-08-10 DIAGNOSIS — Z79899 Other long term (current) drug therapy: Principal | ICD-10-CM

## 2021-08-10 MED ORDER — HUMIRA PEN CITRATE FREE 40 MG/0.4 ML
SUBCUTANEOUS | 4 refills | 28.00000 days | Status: CP
Start: 2021-08-10 — End: ?
  Filled 2021-08-22: qty 8, 28d supply, fill #0

## 2021-08-10 NOTE — Unmapped (Signed)
For skin picking: N-acetylcysteine or acne patches

## 2021-08-10 NOTE — Unmapped (Signed)
Dermatology Note     Assessment and Plan:      Hidradenitis suppurativa - hurley stage II/III - groin, axillae, thighs, torso much improved on humira 40mg  weekly but still flaring  Chronic problem, flaring - not at patient's treatment goal    -This is a chronic illness that is expected to last greater than 1 year and possibly the life of the patient.   - Increase humira from 40mg  weekly to 80mg  weekly.  R/B/A reviewed including but not limited to risk infection, off label use.     High risk medication use - Humira  - Quant gold today   - Hep b and c serologies 07/2020 negative     The patient was advised to call for an appointment should any new, changing, or symptomatic lesions develop.     This visit was billed based on medical decision making as detailed below:   Problem: 1 or more chronic illnesses with exacerbation, progression, or side effects of treatment (moderate)   Data: (Straightforward) Minimal or none   Management: Prescription drug management (moderate)     RTC: Return in about 4 months (around 12/10/2021) for Next scheduled follow up. or sooner as needed   _________________________________________________________________      Chief Complaint     Hidradenitis     HPI     Kelly May is a 40 y.o. female who presents as a returning patient (last seen 10/27/2020) to [facility] for follow up of HS.     Reports significant improvement with Humira but still has flares. Would like to discuss increasing this dose. Tolerating well overall.     The patient denies any other new or changing lesions or areas of concern.     Pertinent Past Medical History   Acne vulgaris     Problem List        Musculoskeletal and Integument    Hidradenitis suppurativa    Relevant Medications    HUMIRA PEN CITRATE FREE 40 MG/0.4 ML        Past Medical History, Family History, Social History, Medication List, Allergies, and Problem List were reviewed in the rooming section of Epic.     ROS: Other than symptoms mentioned in the HPI, no fevers, chills, or other skin complaints    Physical Examination     GENERAL: Well-appearing female in no acute distress, resting comfortably.  NEURO: Alert and oriented, answers questions appropriately  SKIN (Focal Skin Exam): Per patient request, examination of bilateral inguinal thighs, axillae  was performed  - Non draining sinus tracts and inflammatory nodules in bilateral axillae, improved from prior. Other areas clear.    All areas not commented on are within normal limits or unremarkable      (Approved Template 08/09/2020)

## 2021-08-11 DIAGNOSIS — L732 Hidradenitis suppurativa: Principal | ICD-10-CM

## 2021-08-11 LAB — QUANTIFERON TB GOLD PLUS
QUANTIFERON ANTIGEN 1 MINUS NIL: 0.04 [IU]/mL
QUANTIFERON ANTIGEN 2 MINUS NIL: 0.04 [IU]/mL
QUANTIFERON MITOGEN: 9.97 [IU]/mL
QUANTIFERON TB GOLD PLUS: NEGATIVE
QUANTIFERON TB NIL VALUE: 0.03 [IU]/mL

## 2021-08-11 LAB — TB MITOGEN: TB MITOGEN VALUE: 10

## 2021-08-11 LAB — TB NIL: TB NIL VALUE: 0.03

## 2021-08-11 LAB — TB AG2: TB AG2 VALUE: 0.07

## 2021-08-11 LAB — TB AG1: TB AG1 VALUE: 0.07

## 2021-08-11 NOTE — Unmapped (Signed)
Clinical Assessment Needed For: Dose Change  Medication: Humira cf pen 40mg /0.29ml  Last Fill Date/Day Supply: 08/02/21 / 28 days  Prior Authorization Required  Was previous dose already scheduled to fill: No    Notes to Pharmacist:

## 2021-08-12 ENCOUNTER — Ambulatory Visit
Admit: 2021-08-12 | Discharge: 2021-08-13 | Payer: PRIVATE HEALTH INSURANCE | Attending: Physician Assistant | Primary: Physician Assistant

## 2021-08-12 DIAGNOSIS — M545 Chronic low back pain without sciatica, unspecified back pain laterality: Principal | ICD-10-CM

## 2021-08-12 DIAGNOSIS — R400 Somnolence: Principal | ICD-10-CM

## 2021-08-12 DIAGNOSIS — G4733 Obstructive sleep apnea (adult) (pediatric): Principal | ICD-10-CM

## 2021-08-12 DIAGNOSIS — G8929 Other chronic pain: Principal | ICD-10-CM

## 2021-08-12 MED ORDER — METHOCARBAMOL 750 MG TABLET
ORAL_TABLET | 0 refills | 0 days | Status: CP
Start: 2021-08-12 — End: ?

## 2021-08-12 NOTE — Unmapped (Signed)
Patient is requesting the following refill  Requested Prescriptions     Pending Prescriptions Disp Refills   ??? methocarbamoL (ROBAXIN) 750 MG tablet [Pharmacy Med Name: Methocarbamol 750 MG Oral Tablet] 60 tablet 0     Sig: Take 1 tablet by mouth three times daily as needed       Recent Visits  Date Type Provider Dept   07/19/21 Office Visit [address] [place], [facility] [address] [facility]   06/13/21 Office Visit [place], [facility] [address] [facility]   04/28/21 Office Visit Hillary [place], [facility] [place] [facility] [address] [place]   Showing recent visits within past 365 days with a meds authorizing provider and meeting all other requirements  Future Appointments  Date Type Provider Dept   09/01/21 Appointment Hillary Asp[place], [facility] [address] [place]   Showing future appointments within next 365 days with a meds authorizing provider and meeting all other requirements       Labs: N/A

## 2021-08-12 NOTE — Unmapped (Unsigned)
Date of Service: 08/12/2021     Patient Name: Kelly May       MRN: [id number]       Date of Birth: May 05, 1981  Primary Care Physician: Filomena Jungling, [facility]  Referring Provider: Tester, Murray Hodgkins, *  DME***    Assessment and Plan:   Impression:   Kelly May is a 40 y.o. female with ***    OSA: [name] was diagnosed a few years ago at [facility] with sleep apnea via PSG and was provided with a CPAP device that unfortunately did not work.  She had been untreated and was seen here at [facility].  She was reevaluated with a split study which revealed an overall mild OSA which may have been underestimated as there was no supine sleep and Medicare hypopnea criteria of 4% was used in diagnosis.  She was also noted to have hypoventilation.  Treatment portion recommended CPAP 16 cm H20 EPR 2.  Unfortunately, she did not receive the results of her sleep study and did not realize that a phone call nor a voicemail was left.  I did discuss her sleep study with her at length and that we should proceed with Pap therapy.  She was inquiring of use of the hypoglossal nerve stimulator which I informed her that she unfortunately is not a candidate for particularly because of her weight, her hypoventilation and her mild sleep apnea.  She we will proceed with Pap therapy and was made aware of the worldwide shortage of devices.  She was informed of compliance requirements and given tips on getting acclimated to CPAP.  A prescription will be sent to [facility] homecare specialist to get her set up.  ?? CPAP 16 cm H2O EPR 2 with the [employer] Vitera fullface mask  ?? She will inform us of when she receives her CPAP to make sure that she is given an appropriate follow-up date      I personally spent *** minutes face-to-face and non-face-to-face in the care of this patient, which includes all pre, intra, and post visit time on the date of service.      Subjective:   CC:    History of Present Illness:     Kelly May is a 40 y.o. female and was last seen in clinic on ***.        Aviella was diagnosed few years ago and [facility] with sleep apnea via PSG and provided CPAP however her device was not working and she never was treated.  She had a split study that was completed here 03/09/2020 revealing hypoventilation and a mild sleep apnea.  Her treatment recommended CPAP 16 cm H2O EPR 2.  She unfortunately did not know for sleep study results and follows up today for treatment.  She is also interested in use of the inspire hypoglossal nerve stimulator and wanted to discuss this as well.  With regard to sleep she goes to bed between 10 PM-11 PM and she has varying times and when she takes her to fall asleep.  Maybe a few minutes or can be up to an hour.  She tends to rise about 6 AM usually unrefreshed.  She feels quite sleepy during the daytime and tends to fight falling asleep.  She typically does not doze off nor take naps.  She is cut down caffeine in which coffee is used once a month.  Alcohol is rare this is also once a month.  Review of Systems:  A 14-system review was completed and found to be negative except for what is mentioned in the HPI.     PMH:  Past Medical History:   Diagnosis Date   ??? Abnormal TSH 11/27/2019   ??? Acquired hypothyroidism 11/27/2019   ??? Acute bilateral low back pain with sciatica    ??? Anemia    ??? Anxiety    ??? COPD (chronic obstructive pulmonary disease) (CMS-HCC)    ??? Dysthymia    ??? Encounter for supervision of normal pregnancy in multigravida 11/21/2012    FWB: Weekly BPPs starting @ 32-34 weeks for BMI>40. EFW: 2731 gms 6 lbs 0 oz (52%) at 34 1/7.  EFW: 3739 gms 8 lbs 4 oz (83%) at 37 6/7 GBS: neg MOD: Anticipate SVD. Feeding:  PP: LARC (IUD and*   ??? Graves disease    ??? Headache(784.0)    ??? Obesity    ??? OSA (obstructive sleep apnea)    ??? Sleep apnea    ??? Substance abuse (CMS-HCC)    ??? Type 2 diabetes mellitus without complication, without long-term current use of insulin (CMS-HCC) apnea)    ??? Sleep apnea    ??? Substance abuse (CMS-HCC)    ??? Type 2 diabetes mellitus without complication, without long-term current use of insulin (CMS-HCC) 11/27/2019       Past Surgical Hx:  Past Surgical History:   Procedure Laterality Date   ??? CHOLECYSTECTOMY         FamHx:  Family History   Problem Relation Age of Onset   ??? Hypertension Mother    ??? Osteoporosis Mother    ??? Tremor Mother    ??? Diabetes Maternal Aunt    ??? COPD Father    ??? Asthma Brother    ??? Asthma Brother    ??? COPD Maternal Grandfather    ??? Diabetes Maternal Aunt    ??? Diabetes Maternal Uncle    ??? Diabetes Maternal Uncle    ??? Stroke Maternal Uncle    ??? Osteoporosis Maternal Grandmother    ??? Melanoma Neg Hx    ??? Squamous cell carcinoma Neg Hx    ??? Basal cell carcinoma Neg Hx        Social History:  Social History     Socioeconomic History   ??? Marital status: Single   Tobacco Use   ??? Smoking status: Former Smoker     Packs/day: 1.00     Years: 23.00     Pack years: 23.00     Types: Cigarettes     Quit date: 02/08/2021     Years since quitting: 0.5   ??? Smokeless tobacco: Never Used   Vaping Use   ??? Vaping Use: Never used   Substance and Sexual Activity   ??? Alcohol use: Not Currently     Comment: Never have been a heavy drinker   ??? Drug use: Never     Types: Marijuana   ??? Sexual activity: Not Currently     Partners: Male     Birth control/protection: Abstinence   Other Topics Concern   ??? Exercise No   ??? Living Situation No   ??? Do you use sunscreen? Yes   ??? Tanning bed use? Yes   ??? Are you easily burned? No   ??? Excessive sun exposure? Yes   ??? Blistering sunburns? Yes     Social Determinants of Health     Financial Resource Strain: High Risk   ??? Difficulty of Paying  Living Expenses: Very hard       Medications:    Current Outpatient Medications:   ???  buPROPion (WELLBUTRIN XL) 300 MG 24 hr tablet, Take 1 tablet (300 mg total) by mouth every morning., Disp: 90 tablet, Rfl: 3  ???  empty container (SHARPS CONTAINER) Misc, Use as directed, Disp: 1 each, Rfl: 2  ???  ferrous fumarate 324 mg (106 mg iron) Tab, Take 1 tablet (324 mg total) by mouth in the morning., Disp: 90 tablet, Rfl: 1  ???  fluticasone propionate (FLONASE) 50 mcg/actuation nasal spray, 1 spray into each nostril in the morning., Disp: 16 g, Rfl: 11  ???  HUMIRA PEN CITRATE FREE 40 MG/0.4 ML, Inject the contents of 2 pens (80 mg total) under the skin once a week., Disp: 8 each, Rfl: 4  ???  hydroCHLOROthiazide (MICROZIDE) 12.5 mg capsule, Take 1 capsule (12.5 mg total) by mouth every morning., Disp: 30 capsule, Rfl: 11  ???  liraglutide (VICTOZA 3-PAK) 0.6 mg/0.1 mL (18 mg/3 mL) injection, Inject 0.1 mL (0.6 mg total) under the skin daily for 7 days, THEN 0.2 mL (1.2 mg total) daily., Disp: 18 mL, Rfl: 1  ???  metFORMIN (GLUCOPHAGE-XR) 500 MG 24 hr tablet, Take 1 tablet (500 mg total) by mouth in the morning and 1 tablet (500 mg total) in the evening. Take with meals., Disp: 180 tablet, Rfl: 3  ???  methocarbamoL (ROBAXIN) 750 MG tablet, Take 1 tablet by mouth three times daily as needed, Disp: 60 tablet, Rfl: 0  ???  naproxen (NAPROSYN) 500 MG tablet, Take 1 tablet (500 mg total) by mouth two (2) times a day as needed. TAKE with food, Disp: 60 tablet, Rfl: 1  ???  pen needle, diabetic 32 gauge x 1/4 (6 mm) Ndle, Inject 1 each under the skin daily., Disp: 100 each, Rfl: 3  ???  PROAIR HFA 90 mcg/actuation inhaler, Inhale 2 puffs every four (4) hours as needed for wheezing., Disp: 16 g, Rfl: 2  ???  naproxen (NAPROSYN) 500 MG tablet, Take 1 tablet (500 mg total) by mouth in the morning and 1 tablet (500 mg total) in the evening. Take with meals. (Patient not taking: Reported on 08/12/2021), Disp: 60 tablet, Rfl: 1  ???  norgestimate-ethinyl estradioL (ORTHO-CYCLEN) 0.25-35 mg-mcg per tablet, Take 1 tablet by mouth daily. (Patient not taking: Reported on 08/12/2021), Disp: 84 tablet, Rfl: 3    Allergies:  No Known Allergies         Objective:     Physical Exam:     Vitals:    08/12/21 1441   BP: 117/85   BP Site: R Arm   BP Awake alert appropriate  EOMI  Tongue midline  Face symmetric  Clear speech  Follows commands  Moves all extremities spontaneously and antigravity  Normal gait      Epworth Sleepiness Scale: ***    Prior PSG:  ***      Data/ Lab/ Inventory Review:   Polysomnogram results as above  History obtained from ***  Imaging   Blood/ serum laboratory tests  No results found for: CBC  Ferritin   Date Value Ref Range Status   07/19/2021 7.9 7.3 - 270.7 ng/mL Final     Iron Saturation (%)   Date Value Ref Range Status   07/19/2021   Final     Comment:     Unable to calculate     TSH   Date Value Ref Range Status   04/28/2021 2.608  0.550 - 4.780 uIU/mL Final     Free T4   Date Value Ref Range Status   04/28/2021 1.18 0.89 - 1.76 ng/dL Final     Vitamin K-44   Date Value Ref Range Status   11/10/2019 278 193 - 900 pg/ml Final     CO2   Date Value Ref Range Status   07/19/2021 26.9 20.0 - 31.0 mmol/L Final           Author:  Patrcia Dolly 08/12/2021 3:39 PM    Note - This record has been created using [employer]. Chart creation errors have been sought, but may not always have been located. Such creation errors do not reflect on the standard of medical care.

## 2021-08-12 NOTE — Unmapped (Addendum)
You are not to drive when sleepy or drowsy as this may be dangerous to yourself and others.  It is also illegal !!    Sleep apnea:  You have sleep apnea as well as a hypoventilation syndrome and this needs to be treated with CPAP.  In the meantime, continue to work with [employer] [facility] on weight loss.    Below is important information on cpap that I want you to read.  It tells you the  importance of its treatment , how to get used to it and how to keep your machine once you have received it.      Untreated sleep apnea is an independent risk factor for cardiovascular disease including but not limited to hypertension, arrhythmias, heart attacks, and stroke.  We want to help you with this as well as getting you better sleep.  CPAP would be the best way to treat this at this time and I want to give you some information on how best to get acclimated to it. CPAP is just a fancy air machine that is used to keep your airway open while sleeping, so that you can breath.  I do have to inform you that there is a current nationwide shortage on CPAP devices and it may take 8-12 weeks to receive 1.  Once you do receive your device, please send me a message on my chart or call the clinic to inform me of the exact date.  This will ensure that you have an appropriate appointment date, for the required follow-up.    Wear your cpap mask during the daytime while engaged in activities (reading, watching tv, etc.). Try increasing the time that you use it during the daytime.  Use it for at least an hour during the daytime and everyday increase the time by half an hour.  When you are going to wear it before sleeping, do so for 45 min-1 hour before.   These steps can be helpful with desensitizing yourself to the machine.    [facility] Homecare specialists  [phone]    Make sure that the respiratory therapist fits you with an appropriate mask or you can use the one they used in the sleep lab. You need one that FITS YOU PROPERLY and COMFORTABLY. Make sure that the therapist takes time to go through all the steps with adjusting the mask as well as troubleshooting issues.  They are a great resource for future problems as well.    JUST A REMINDER, THAT YOU WILL NEED TO FOLLOW UP WITH ME WITHIN 31-91 DAYS AFTER YOU RECEIVE YOUR CPAP MACHINE FOR A COMPLIANCE CHECK.  The insurance companies require 70% usage of the cpap for more than 4 hours a day/night, other wise they will take the machine back.

## 2021-08-15 NOTE — Unmapped (Signed)
Therapy Update Follow Up: No issues - Copay = $4

## 2021-08-18 NOTE — Unmapped (Unsigned)
Hematology Consult Note    Referring Physician :  Tester, Murray Hodgkins, *    Primary Care Physician:   Filomena Jungling, [facility]    Reason for Consult:  Anemia    Assessment/Recommendations:  Kelly May is a 40 y.o. Caucasian*** female with a history of AUB, anxiety/ depression, anemia, hypothyroidism from Graves disease, hidradenitis suppurativa (on Humira), OSA with COPD/hypoventilation, obesity, DMT2, *** who we are seeing in consultation at the request of [employer] for further evaluation of anemia.    1. Anemia, {anemia type micro normo macro:63979}:  Available labs date back to ***. First noted to be anemic ***. Most recent labs from *** show ***. Most likely cause of iron deficiency anemia is ***. See detailed HPI below for further information. Based on labs from ***, iron deficit is ***.  Discussed with the patient my recommendation for IV iron replacement with {Iron Replacement:63981}.  Reviewed risks and benefits of IV iron including the risk of anaphylaxis.  Process for administration was reviewed including premedications and monitoring. All questions were answered and the patient was in agreement with this plan.    Plan:  - Labs collected today: CBC, Iron Studies: Iron, Iron Sat, Transferrin, TIBC, Ferritin, Sed Rate, CRP and B12, folate  - Plan for IV iron with {Iron Replacement:63981} - *** mg to be given over {numberlist:31955} infusions.   - Continue PO iron supplements as tolerated. Can decrease to 1 tab every other day, with Vitamin C. Recent research suggests that taking 1 tab PO iron every other day is just as effective as taking iron daily or BID, with fewer GI side effects. ***  - Will hold off on IV iron supplementation at this time.***  - Return in {number of months:64397} for repeat labs and assessment.    Factors that may be associated with increased risk/severity of hypersensitivity reaction to IV iron  History of intolerance to IV iron products {Blank single:19197::yes,no} History of >= 3 drug/food/other allergies {Blank single:19197::yes,no}   Severe asthma/eczema   {Blank single:19197::yes,no}   Mastocytosis     {Blank single:19197::yes,no}   Severe respiratory/cardiac disease  {Blank single:19197::yes,no}   Old age (>108y)    {Blank single:19197::yes,no}   Systemic inflammatory disease (RA, SLE) {Blank single:19197::yes,no}   Anxiety over receiving this iron preparation? {Blank single:19197::yes,no}     Schedule {Iron Replacement:63981} at {[place]:76726}    History of Present Illness:   Available labs date back to 2013, normocytic anemia at that time, with Hgb 9-11. Microcytic anemia has occurred over the past year. Most recent labs on 07/19/21 show Hgb 10.6, MCV 73.5, plt 311, iron <2, and ferritin 7.9. TSH on 04/28/21 was normal. Vitamin B12 on 10/2019 was low normal, folate N/A. Of note, she has chronically elevated WBC that has ranged from 9.6-18 from 2013-2022. Neutrophils, lymphocytes and basophils have all been intermittently elevated, however they have been completely normal at times as well. Over the past year, albumin has been low and LFTs have been mildly elevated. Abd [place] in 2016 showed multiple gallstones and fatty infiltrative changes of the liver.    Most likely cause of iron deficiency anemia is ***.     AUB: Provera 10 mg TID prescribed 06/13/21. TVUS is scheduled for 09/14/21 and GYN appt is scheduled for 10/05/21.    Tolerated PO iron? ***  Hx of blood transfusion or IV iron? ***    At the time of the initial assessment, She {admits denies:63987} known GI bleeding (melena, hematochezia).  She {admits  denies:63987} history of bleeding complications with prior surgery, mucosal bleeding, or abnormal bruising.  Her energy level and concentration has been normal***.  She {admits denies:63987} dietary restrictions. She {admits denies:63987} brittle hair or nails. She {admits denies:63987} pica. She {admits denies:63987} shortness of breath with exertion or at rest, dizziness, syncope, chest pain.    Visit Diagnoses:  No diagnosis found.    Medical History:  Past Medical History:   Diagnosis Date   ??? Abnormal TSH 11/27/2019   ??? Acquired hypothyroidism 11/27/2019   ??? Acute bilateral low back pain with sciatica    ??? Anemia    ??? Anxiety    ??? COPD (chronic obstructive pulmonary disease) (CMS-HCC)    ??? Dysthymia    ??? Encounter for supervision of normal pregnancy in multigravida 11/21/2012    FWB: Weekly BPPs starting @ 32-34 weeks for BMI>40. EFW: 2731 gms 6 lbs 0 oz (52%) at 34 1/7.  EFW: 3739 gms 8 lbs 4 oz (83%) at 37 6/7 GBS: neg MOD: Anticipate SVD. Feeding:  PP: LARC (IUD and*   ??? Graves disease    ??? Headache(784.0)    ??? Obesity    ??? OSA (obstructive sleep apnea)    ??? Sleep apnea    ??? Substance abuse (CMS-HCC)    ??? Type 2 diabetes mellitus without complication, without long-term current use of insulin (CMS-HCC) 11/27/2019       Surgical History:  Past Surgical History:   Procedure Laterality Date   ??? CHOLECYSTECTOMY         ***    Social History:  Social History     Socioeconomic History   ??? Marital status: Single   Tobacco Use   ??? Smoking status: Former Smoker     Packs/day: 1.00     Years: 23.00     Pack years: 23.00     Types: Cigarettes     Quit date: 02/08/2021     Years since quitting: 0.5   ??? Smokeless tobacco: Never Used   Vaping Use   ??? Vaping Use: Never used   Substance and Sexual Activity   ??? Alcohol use: Not Currently     Comment: Never have been a heavy drinker   ??? Drug use: Never     Types: Marijuana   ??? Sexual activity: Not Currently     Partners: Male     Birth control/protection: Abstinence   Other Topics Concern   ??? Exercise No   ??? Living Situation No   ??? Do you use sunscreen? Yes   ??? Tanning bed use? Yes   ??? Are you easily burned? No   ??? Excessive sun exposure? Yes   ??? Blistering sunburns? Yes     Social Determinants of Health     Financial Resource Strain: High Risk   ??? Difficulty of Paying Living Expenses: Very hard       Kelly May was born in *** and currently lives in *** with ***.  Works ***.  Hobbies include ***.     Tobacco history: ***  Alcohol history: ***  Illicit drug history: ***    Family History:  family history includes Asthma in her brother and brother; COPD in her father and maternal grandfather; Diabetes in her maternal aunt, maternal aunt, maternal uncle, and maternal uncle; Hypertension in her mother; Osteoporosis in her maternal grandmother and mother; Stroke in her maternal uncle; Tremor in her mother.     ***    Allergies:  Patient has no  known allergies.    Medications:   Current Outpatient Medications   Medication Sig Dispense Refill   ??? buPROPion (WELLBUTRIN XL) 300 MG 24 hr tablet Take 1 tablet (300 mg total) by mouth every morning. 90 tablet 3   ??? empty container (SHARPS CONTAINER) Misc Use as directed 1 each 2   ??? ferrous fumarate 324 mg (106 mg iron) Tab Take 1 tablet (324 mg total) by mouth in the morning. 90 tablet 1   ??? fluticasone propionate (FLONASE) 50 mcg/actuation nasal spray 1 spray into each nostril in the morning. 16 g 11   ??? HUMIRA PEN CITRATE FREE 40 MG/0.4 ML Inject the contents of 2 pens (80 mg total) under the skin once a week. 8 each 4   ??? hydroCHLOROthiazide (MICROZIDE) 12.5 mg capsule Take 1 capsule (12.5 mg total) by mouth every morning. 30 capsule 11   ??? liraglutide (VICTOZA 3-PAK) 0.6 mg/0.1 mL (18 mg/3 mL) injection Inject 0.1 mL (0.6 mg total) under the skin daily for 7 days, THEN 0.2 mL (1.2 mg total) daily. 18 mL 1   ??? metFORMIN (GLUCOPHAGE-XR) 500 MG 24 hr tablet Take 1 tablet (500 mg total) by mouth in the morning and 1 tablet (500 mg total) in the evening. Take with meals. 180 tablet 3   ??? methocarbamoL (ROBAXIN) 750 MG tablet Take 1 tablet by mouth three times daily as needed 60 tablet 0   ??? naproxen (NAPROSYN) 500 MG tablet Take 1 tablet (500 mg total) by mouth two (2) times a day as needed. TAKE with food 60 tablet 1   ??? norgestimate-ethinyl estradioL (ORTHO-CYCLEN) 0.25-35 mg-mcg per tablet Take 1 tablet by mouth daily. (Patient not taking: Reported on 08/12/2021) 84 tablet 3   ??? pen needle, diabetic 32 gauge x 1/4 (6 mm) Ndle Inject 1 each under the skin daily. 100 each 3   ??? PROAIR HFA 90 mcg/actuation inhaler Inhale 2 puffs every four (4) hours as needed for wheezing. 16 g 2     No current facility-administered medications for this visit.       Review of Systems:  As per HPI, otherwise negative x 12 systems.    Objective :  There were no vitals filed for this visit.    Physical Exam:  GEN: Well-appearing female in no acute distress.  HEENT: Mask in place, normocephalic, no conjunctival injection, sclera anicteric.  CV: RRR, No murmurs, rubs, or gallops.  RESP: CTAB, breathing unlabored, no wheezes, rhonchi.  GI: Abdomen soft, round, nondistended, nontender to palpation.  No palpable hepatomegaly or splenomegaly.  NEURO: A&Ox3, CNII-XII grossly intact.  PSY: Appropriate affect, reasonable insight and judgment.  Extremities: Bilateral extremities without swelling or edema, equal in size, without erythema.  Skin: Dry, warm, no rashes.    Test Results  Lab Results   Component Value Date    WBC 11.4 (H) 07/19/2021    HGB 10.6 (L) 07/19/2021    HCT 34.9 07/19/2021    PLT 311 07/19/2021          Lab Results   Component Value Date    IRON <2 (L) 07/19/2021    TIBC 389.3 07/19/2021    FERRITIN 7.9 07/19/2021        Results for orders placed or performed in visit on 08/10/21   Quantiferon TB Gold Plus   Result Value Ref Range    Quantiferon TB Gold Plus Interpretation Negative Negative    Quantiferon TB NIL value 0.03 IU/mL    Quantiferon Mitogen  Minus Nil 9.97 IU/mL    Quantiferon Antigen 1 minus Nil 0.04 IU/mL    Quantiferon Antigen 2 minus NIL 0.04 IU/mL   Quant TB Nil value   Result Value Ref Range    TB NIL VALUE 0.03    Quant TB AG1 value   Result Value Ref Range    TB AG1 VALUE 0.07    Quant TB AG2 value   Result Value Ref Range    TB AG2 VALUE 0.07    Quant TB Mitogen value   Result Value Ref Range    TB Mitogen 10.00        I personally spent *** minutes face-to-face and non-face-to-face in the care of the patient on the date of service.  This included taking a history, examining the patient, reviewing records, developing and discussing the management plan, documentation, and communications with other health care professionals.       Glendell Docker, NP  [facility] Hematology

## 2021-08-19 NOTE — Unmapped (Signed)
I reviewed with Jewelia that her 80mg /week dosing for hidradenitis had been approved. We discussed she should inject 2 pens at a time, at least 2 inches apart.     [facility] Clinical Assessment & Refill Coordination Note    Kelly May, DOB: 11/18/1981  Phone: [phone] (home)     All above HIPAA information was verified with patient.     Was a Nurse, learning disability used for this call? No    Specialty Medication(s):   Inflammatory Disorders: Humira     Current Outpatient Medications   Medication Sig Dispense Refill   ??? buPROPion (WELLBUTRIN XL) 300 MG 24 hr tablet Take 1 tablet (300 mg total) by mouth every morning. 90 tablet 3   ??? empty container (SHARPS CONTAINER) Misc Use as directed 1 each 2   ??? ferrous fumarate 324 mg (106 mg iron) Tab Take 1 tablet (324 mg total) by mouth in the morning. 90 tablet 1   ??? fluticasone propionate (FLONASE) 50 mcg/actuation nasal spray 1 spray into each nostril in the morning. 16 g 11   ??? HUMIRA PEN CITRATE FREE 40 MG/0.4 ML Inject the contents of 2 pens (80 mg total) under the skin once a week. 8 each 4   ??? hydroCHLOROthiazide (MICROZIDE) 12.5 mg capsule Take 1 capsule (12.5 mg total) by mouth every morning. 30 capsule 11   ??? liraglutide (VICTOZA 3-PAK) 0.6 mg/0.1 mL (18 mg/3 mL) injection Inject 0.1 mL (0.6 mg total) under the skin daily for 7 days, THEN 0.2 mL (1.2 mg total) daily. 18 mL 1   ??? metFORMIN (GLUCOPHAGE-XR) 500 MG 24 hr tablet Take 1 tablet (500 mg total) by mouth in the morning and 1 tablet (500 mg total) in the evening. Take with meals. 180 tablet 3   ??? methocarbamoL (ROBAXIN) 750 MG tablet Take 1 tablet by mouth three times daily as needed 60 tablet 0   ??? naproxen (NAPROSYN) 500 MG tablet Take 1 tablet (500 mg total) by mouth two (2) times a day as needed. TAKE with food 60 tablet 1   ??? norgestimate-ethinyl estradioL (ORTHO-CYCLEN) 0.25-35 mg-mcg per tablet Take 1 tablet by mouth daily. (Patient not taking: Reported on 08/12/2021) 84 tablet 3   ??? pen needle, diabetic 32 gauge x 1/4 (6 mm) Ndle Inject 1 each under the skin daily. 100 each 3   ??? PROAIR HFA 90 mcg/actuation inhaler Inhale 2 puffs every four (4) hours as needed for wheezing. 16 g 2     No current facility-administered medications for this visit.        Changes to medications: Dawnyel reports no changes at this time.    No Known Allergies    Changes to allergies: No    SPECIALTY MEDICATION ADHERENCE     Humira - 2 left (2 x 40 mg pens)  Medication Adherence    Patient reported X missed doses in the last month: 0  Specialty Medication: Humira  Patient is on additional specialty medications: No          Specialty medication(s) dose(s) confirmed: dose increase with this fill     Are there any concerns with adherence? No    Adherence counseling provided? Not needed    CLINICAL MANAGEMENT AND INTERVENTION      Clinical Benefit Assessment:    Do you feel the medicine is effective or helping your condition? Yes    Clinical Benefit counseling provided? Not needed    Adverse Effects Assessment:  Are you experiencing any side effects? No    Are you experiencing difficulty administering your medicine? No    Quality of Life Assessment:    Quality of Life    Rheumatology  Oncology  Dermatology  1. What impact has your specialty medication had on the symptoms of your skin condition (i.e. itchiness, soreness, stinging)?: Some  2. What impact has your specialty medication had on your comfort level with your skin?: Some  Cystic Fibrosis          Have you discussed this with your provider? Not needed    Acute Infection Status:    Acute infections noted within Epic:  No active infections  Patient reported infection: None    Therapy Appropriateness:    Is therapy appropriate and patient progressing towards therapeutic goals? Yes, therapy is appropriate and should be continued    DISEASE/MEDICATION-SPECIFIC INFORMATION      For patients on injectable medications: Patient currently has 1 (2 x 40 mg pens) doses left.  Next injection is scheduled for 9/24 - instructed patient to go ahead and inject both pens tomorrow to equal new 80 mg/week dose.    PATIENT SPECIFIC NEEDS     - Does the patient have any physical, cognitive, or cultural barriers? No    - Is the patient high risk? No    - Does the patient require a Care Management Plan? No     - Does the patient require physician intervention or other additional services (i.e. nutrition, smoking cessation, social work)? No      SHIPPING     Specialty Medication(s) to be Shipped:   Inflammatory Disorders: Humira    Other medication(s) to be shipped: No additional medications requested for fill at this time     Changes to insurance: No    Delivery Scheduled: Yes, Expected medication delivery date: Tues, 9/27.     Medication will be delivered via UPS to the confirmed prescription address in [facility].    The patient will receive a drug information handout for each medication shipped and additional FDA Medication Guides as required.  Verified that patient has previously received a Conservation officer, historic buildings and a Surveyor, mining.    The patient or caregiver noted above participated in the development of this care plan and knows that they can request review of or adjustments to the care plan at any time.      All of the patient's questions and concerns have been addressed.    [name]   [facility] Specialty Pharmacist

## 2021-08-19 NOTE — Unmapped (Unsigned)
Name:  Kelly May  DOB: 03/15/1981  Today's Date: 08/19/2021  Age:  40 y.o.    A/P:  {Assess/Plan SmartLinks:21044}    Medication adherence and barriers to the treatment plan have been addressed. Opportunities to optimize healthy behaviors have been discussed. Patient / caregiver voiced understanding.      No follow-ups on file.    S:  Kelly May is a 39 y.o. female who presents for follow up     AUB  Patient was prescribed norgestimate-ethinyl estradiol once daily, and patient reports she no longer is taking this. She is *** taking an iron supplement daily. Her last menses *** TVUS not yet complete. Hematology referral placed 07/28/21 2/2 low iron stores (<2 on 07/19/21).     Patient with history of AUB- suspect PCOS/anovulatory bleeding vs structural cause    Depression and anxiety  Wellbutrin XL 300 mg daily     OSA  Patient needed CPAP at last OV, followd by [facility], Patrcia Dolly PA) last visit 08/12/21. Patient was prescribed correct CPAP at this visit.     Hidradenitis suppurativa   Humira 80 mg weekly works well *** , followed by derm               ROS:  A 12 point review of systems was negative except for pertinent items noted in the HPI.    Past medical history, family history, surgical history and social history personally reviewed and verified with patient.     PROBLEM LIST  Patient Active Problem List   Diagnosis   ??? OSA (obstructive sleep apnea)   ??? Morbid obesity (CMS-HCC)   ??? Headache, acute   ??? Postpartum care following vaginal delivery   ??? Annual physical exam   ??? SOB (shortness of breath)   ??? Fatigue   ??? Peripheral edema   ??? Chronic low back pain without sciatica   ??? Vitamin D deficiency   ??? Type 2 diabetes mellitus without complication, without long-term current use of insulin (CMS-HCC)   ??? Acquired hypothyroidism   ??? Acute bilateral low back pain with sciatica   ??? Graves disease   ??? Routine screening for STI (sexually transmitted infection)   ??? Mixed anxiety and depressive disorder   ??? Hidradenitis suppurativa   ??? Tobacco abuse   ??? Rectal bleeding   ??? Acute pain of right knee   ??? Abnormal uterine bleeding (AUB)   ??? Dizziness   ??? Urinary urgency   ??? Birth control counseling       O:  There were no vitals filed for this visit.  General appearance: No acute distress, well appearing and well nourished.   HEENT: Normocephalic, atraumatic. Conjunctiva normal, lids w/o swelling, erythema or discharge. TMs gray with intact cone of light. Nasal mucosa, septum, and turbinates w/o edema or erythema. OP normal with no erythema, edema, exudate or lesions.   NECK: Supple, symmetric, trachea midline, no masses. No lymphadenopathy. No thyromegaly or masses.  HEART: RRR w/ normal S1 and S2. No MRG. DP and PT pulses  2+ bilaterally. No peripheral edema or varicosities.  LUNGS: No signs of respiratory distress. CTA all fields. No wheezes, rales, rhonchi or crackles.   ABDOMEN: Non-tender, no masses. Normoactive BS. No hernia appreciated.   MSK: Normal ROM. Strength normal, equal bilaterally.   SKIN: Warm, dry, intact. No suspicious lesions noted on face, trunk, or extremeties.    NEURO: Cranial nerves II-XII intact. Reflexes 2+ and symmetric. No sensory loss.  PSYCH: A&O x 3. Affect normal. Judgment, insight normal. Makes good eye contact. Smiles appropriately. Well-kempt.    I attest that I, Katheran Awe Donovin Kraemer, personally documented this note while acting as scribe for [employer], PAC.      Quetzaly Ebner E Daisja Kessinger, Scribe.  09/01/2021     The documentation recorded by the scribe accurately reflects the service I personally performed and the decisions made by me.    Hillary A Tester, PAC

## 2021-08-23 NOTE — Unmapped (Signed)
Hematology Consult Note    Referring Physician :  Tester, Murray Hodgkins, *    Primary Care Physician:   Filomena Jungling, [facility]    Reason for Consult:  Anemia    Assessment/Recommendations:  Kelly May is a 40 y.o. Caucasian female with a history of AUB, anxiety/depression, anemia, hypothyroidism from Graves disease, hidradenitis suppurativa (on Humira), OSA with COPD/hypoventilation, obesity, DMT2 who we are seeing in consultation at the request of [facility] for further evaluation of anemia.    1. Anemia, Microcytic:  Available labs date back to 2013, and was anemic at that time. Microcytic anemia has worsened over the past year, coinciding with heavier periods. Most recent labs from 08/29/21 show Hgb 9.9, MCV 71.9; Ferritin on 07/19/21 was 7.9. Most likely cause of iron deficiency anemia is menorrhagia. She has been unable to afford OCPs/Provera. She has f/u with GYN in 1 month for further evaluation and treatment options. She should keep this appointment. Her hemoglobin continues to fall despite oral iron, so in the meantime we will replete iron stores with IV iron. Based on labs from 08/29/21, iron deficit is ~2100-2500 mg. Discussed with the patient my recommendation for IV iron replacement with IV iron - Feraheme.  Reviewed risks and benefits of IV iron including the risk of anaphylaxis.  Process for administration was reviewed including premedications and monitoring. All questions were answered and the patient was in agreement with this plan.    Plan:  - Labs collected today: CBC and B12, folate  - Plan for IV iron with IV iron - Feraheme - 2040 mg to be given over 4 infusions.   - Continue PO iron supplements as tolerated. Can decrease to 1 tab every other day, with Vitamin C. Recent research suggests that taking 1 tab PO iron every other day is just as effective as taking iron daily or BID, with fewer GI side effects.   - Recommend women's MVI daily, to address low folate level  - Return in 3 months for repeat labs and assessment     Factors that may be associated with increased risk/severity of hypersensitivity reaction to IV iron  History of intolerance to IV iron products no   History of >= 3 drug/food/other allergies no   Severe asthma/eczema   no   Mastocytosis     no   Severe respiratory/cardiac disease  no   Old age (>69y)    no   Systemic inflammatory disease (RA, SLE) no   Anxiety over receiving this iron preparation? no     Schedule IV iron - Feraheme at [facility]    History of Present Illness:   Available labs date back to 2013, normocytic anemia at that time, with Hgb 9-11. Microcytic anemia has occurred over the past year. Most recent labs on 07/19/21 show Hgb 10.6, MCV 73.5, plt 311, iron <2, and ferritin 7.9. TSH on 04/28/21 was normal. Vitamin B12 on 10/2019 was low normal, folate N/A. Of note, she has chronically elevated WBC that has ranged from 9.6-18 from 2013-2022. Neutrophils, lymphocytes and basophils have all been intermittently elevated, however they have been completely normal at times as well. Over the past year, albumin has been low and LFTs have been mildly elevated. Abd [place] in 2016 showed multiple gallstones and fatty infiltrative changes of the liver.    Her diet includes meat daily, including red meat, rarely beans, some spinach and other fruits and vegetables, no iron fortified cereals. Loves liver but doesn't cook  it much. She has had no bariatric surgeries, and does not have s/sx of IBD or celiac disease. She started taking daily iron 1-2 months ago and is tolerating that well. She has never had a blood transfusion or IV iron.     She does have monthly periods, which are prolonged, lasting on average 2-3 weeks, all days are heavy. She wears ultra tampons and has to change them hourly-Q2-3H, and often wears diapers to prevent blood leakage onto clothes. She reports passing clots the size of her fist. Periods have gotten heavier over the past 1-2 years. PCP has prescribed Provera/OCPs, but she has not started them yet because she can not afford to pick them up. Additionally, she is scheduled for a TVUS on 09/14/21 and GYN on 10/05/21 for further evaluation.     She reports a hx of hemorrhoids, but states they are not bleeding currently. She also has a hx of GERD, but also states this is not active currently. She does take naproxen when she has to be up on her feet (like grocery shopping), approx 2-3x/week. Denies s/sx of GIB at initial visit. She has never had colonoscopy or EGD.      At the time of the initial assessment, She denies known GI bleeding (melena, hematochezia).  She denies history of bleeding complications with prior surgery, mucosal bleeding, or abnormal bruising.  Her energy level and concentration has been poor.  She denies dietary restrictions. She denies brittle hair or nails. She admits pica (ice). She admits shortness of breath with exertion or at rest, dizziness, near-syncope, heart racing. Denies chest pain.    If iron deficiency anemia persists after menorrhagia is controlled, will need to pursue additional w/u for occult GIB, given hx of GERD and hemorrhoids. Will defer for now, given significant menorrhagia is likely largest contributor to anemia.       Visit Diagnoses:  1. Iron deficiency anemia due to chronic blood loss        Medical History:  Past Medical History:   Diagnosis Date   ??? Abnormal TSH 11/27/2019   ??? Acquired hypothyroidism 11/27/2019   ??? Acute bilateral low back pain with sciatica    ??? Anemia    ??? Anxiety    ??? COPD (chronic obstructive pulmonary disease) (CMS-HCC)    ??? Dysthymia    ??? Encounter for supervision of normal pregnancy in multigravida 11/21/2012    FWB: Weekly BPPs starting @ 32-34 weeks for BMI>40. EFW: 2731 gms 6 lbs 0 oz (52%) at 34 1/7.  EFW: 3739 gms 8 lbs 4 oz (83%) at 37 6/7 GBS: neg MOD: Anticipate SVD. Feeding:  PP: LARC (IUD and*   ??? Graves disease    ??? Headache(784.0)    ??? Obesity    ??? OSA (obstructive sleep apnea)    ??? Sleep apnea    ??? Substance abuse (CMS-HCC)    ??? Type 2 diabetes mellitus without complication, without long-term current use of insulin (CMS-HCC) 11/27/2019       Surgical History:  Past Surgical History:   Procedure Laterality Date   ??? CHOLECYSTECTOMY     - All teeth have been pulled  - No hx excessive bleeding with surgeries    Social History:  Social History     Socioeconomic History   ??? Marital status: Single     Spouse name: None   ??? Number of children: None   ??? Years of education: None   ??? Highest education level: None   Tobacco  Use   ??? Smoking status: Former Smoker     Packs/day: 1.00     Years: 23.00     Pack years: 23.00     Types: Cigarettes     Quit date: 02/08/2021     Years since quitting: 0.5   ??? Smokeless tobacco: Never Used   Vaping Use   ??? Vaping Use: Never used   Substance and Sexual Activity   ??? Alcohol use: Not Currently     Comment: Never have been a heavy drinker   ??? Drug use: Never     Types: Marijuana   ??? Sexual activity: Not Currently     Partners: Male     Birth control/protection: Abstinence   Other Topics Concern   ??? Exercise No   ??? Living Situation No   ??? Do you use sunscreen? Yes   ??? Tanning bed use? Yes   ??? Are you easily burned? No   ??? Excessive sun exposure? Yes   ??? Blistering sunburns? Yes     Social Determinants of Health     Financial Resource Strain: High Risk   ??? Difficulty of Paying Living Expenses: Very hard       Kelly May was born in [place] and currently lives in [place], [place] with her children (7 and 8 in 2022).  Does not work outside the home. Hobbies include gaming.     Tobacco history: Former smoker as detailed above  Alcohol history: Rarely (1x/every other month)  Illicit drug history: Remote hx of marijuana use    Family History:  family history includes Asthma in her brother and brother; COPD in her father and maternal grandfather; Diabetes in her maternal aunt, maternal aunt, maternal uncle, and maternal uncle; Hypertension in her mother; Osteoporosis in her maternal grandmother and mother; Stroke in her maternal uncle; Tremor in her mother.     Mom with iron deficiency anemia of unknown cause    Allergies:  Patient has no known allergies.    Medications:   Current Outpatient Medications   Medication Sig Dispense Refill   ??? buPROPion (WELLBUTRIN XL) 300 MG 24 hr tablet Take 1 tablet (300 mg total) by mouth every morning. 90 tablet 3   ??? ferrous fumarate 324 mg (106 mg iron) Tab Take 1 tablet (324 mg total) by mouth in the morning. 90 tablet 1   ??? fluticasone propionate (FLONASE) 50 mcg/actuation nasal spray 1 spray into each nostril in the morning. 16 g 11   ??? HUMIRA PEN CITRATE FREE 40 MG/0.4 ML Inject the contents of 2 pens (80 mg total) under the skin once a week. 8 each 4   ??? hydroCHLOROthiazide (MICROZIDE) 12.5 mg capsule Take 1 capsule (12.5 mg total) by mouth every morning. 30 capsule 11   ??? liraglutide (VICTOZA 3-PAK) 0.6 mg/0.1 mL (18 mg/3 mL) injection Inject 0.1 mL (0.6 mg total) under the skin daily for 7 days, THEN 0.2 mL (1.2 mg total) daily. 18 mL 1   ??? metFORMIN (GLUCOPHAGE-XR) 500 MG 24 hr tablet Take 1 tablet (500 mg total) by mouth in the morning and 1 tablet (500 mg total) in the evening. Take with meals. 180 tablet 3   ??? methocarbamoL (ROBAXIN) 750 MG tablet Take 1 tablet by mouth three times daily as needed 60 tablet 0   ??? naproxen (NAPROSYN) 500 MG tablet Take 1 tablet (500 mg total) by mouth two (2) times a day as needed. TAKE with food 60 tablet 1   ???  PROAIR HFA 90 mcg/actuation inhaler Inhale 2 puffs every four (4) hours as needed for wheezing. 16 g 2   ??? empty container (SHARPS CONTAINER) Misc Use as directed 1 each 2   ??? norgestimate-ethinyl estradioL (ORTHO-CYCLEN) 0.25-35 mg-mcg per tablet Take 1 tablet by mouth daily. (Patient not taking: No sig reported) 84 tablet 3   ??? pen needle, diabetic 32 gauge x 1/4 (6 mm) Ndle Inject 1 each under the skin daily. 100 each 3     No current facility-administered medications for this visit. Review of Systems:  As per HPI, otherwise negative x 12 systems.    Objective :  Vitals:    08/29/21 1302   BP: 122/84   Pulse: 105   Temp: 36.5 ??C (97.7 ??F)   SpO2: 97%   Weight: (!) 170.3 kg (375 lb 6.4 oz)   Height: 167 cm (5' 5.75)       Physical Exam:  GEN: Well-appearing obese female in no acute distress.  HEENT: Mask in place, normocephalic, no conjunctival injection, sclera anicteric.  CV: RRR, No murmurs, rubs, or gallops.  RESP: CTAB, breathing unlabored, no wheezes, rhonchi.  NEURO: A&Ox3, CNII-XII grossly intact.  PSY: Appropriate affect, reasonable insight and judgment.  Skin: Dry, warm    Test Results  Lab Results   Component Value Date    WBC 8.3 08/29/2021    HGB 9.9 (L) 08/29/2021    HCT 32.2 (L) 08/29/2021    PLT 335 08/29/2021          Lab Results   Component Value Date    IRON <2 (L) 07/19/2021    TIBC 389.3 07/19/2021    FERRITIN 7.9 07/19/2021        Results for orders placed or performed in visit on 08/29/21   Vitamin B12 Level   Result Value Ref Range    Vitamin B-12 309 211 - 911 pg/ml   Folate Level   Result Value Ref Range    Folate 3.2 (L) >=5.4 ng/mL   CBC w/ Differential   Result Value Ref Range    WBC 8.3 3.6 - 11.2 10*9/L    RBC 4.47 3.95 - 5.13 10*12/L    HGB 9.9 (L) 11.3 - 14.9 g/dL    HCT 60.4 (L) 54.0 - 44.0 %    MCV 71.9 (L) 77.6 - 95.7 fL    MCH 22.1 (L) 25.9 - 32.4 pg    MCHC 30.8 (L) 32.0 - 36.0 g/dL    RDW 98.1 (H) 19.1 - 15.2 %    MPV 8.7 6.8 - 10.7 fL    Platelet 335 150 - 450 10*9/L    Neutrophils % 58.8 %    Lymphocytes % 31.4 %    Monocytes % 5.9 %    Eosinophils % 3.6 %    Basophils % 0.3 %    Absolute Neutrophils 4.9 1.8 - 7.8 10*9/L    Absolute Lymphocytes 2.6 1.1 - 3.6 10*9/L    Absolute Monocytes 0.5 0.3 - 0.8 10*9/L    Absolute Eosinophils 0.3 0.0 - 0.5 10*9/L    Absolute Basophils 0.0 0.0 - 0.1 10*9/L    Microcytosis Slight (A) Not Present    Anisocytosis Moderate (A) Not Present     Glendell Docker, NP  [facility]

## 2021-08-26 NOTE — Unmapped (Signed)
Please have patient contact Customer service Team ??[phone]       We have received the order for PAP device for your patient. ??We will be processing your order and someone from our customer service dept. will contact the patient to schedule a Respiratory Therapist visit.   However, due to the global manufacturer shortage of devices at this time, there could be a delay in set up. ??We will scheduled patients based on device availability.   Please note there could be a 2-3 month wait.     Thanks,   Nate

## 2021-08-29 ENCOUNTER — Ambulatory Visit
Admit: 2021-08-29 | Discharge: 2021-08-30 | Payer: PRIVATE HEALTH INSURANCE | Attending: Nurse Practitioner | Primary: Nurse Practitioner

## 2021-08-29 DIAGNOSIS — D500 Iron deficiency anemia secondary to blood loss (chronic): Principal | ICD-10-CM

## 2021-08-29 LAB — CBC W/ AUTO DIFF
BASOPHILS ABSOLUTE COUNT: 0 10*9/L (ref 0.0–0.1)
BASOPHILS RELATIVE PERCENT: 0.3 %
EOSINOPHILS ABSOLUTE COUNT: 0.3 10*9/L (ref 0.0–0.5)
EOSINOPHILS RELATIVE PERCENT: 3.6 %
HEMATOCRIT: 32.2 % — ABNORMAL LOW (ref 34.0–44.0)
HEMOGLOBIN: 9.9 g/dL — ABNORMAL LOW (ref 11.3–14.9)
LYMPHOCYTES ABSOLUTE COUNT: 2.6 10*9/L (ref 1.1–3.6)
LYMPHOCYTES RELATIVE PERCENT: 31.4 %
MEAN CORPUSCULAR HEMOGLOBIN CONC: 30.8 g/dL — ABNORMAL LOW (ref 32.0–36.0)
MEAN CORPUSCULAR HEMOGLOBIN: 22.1 pg — ABNORMAL LOW (ref 25.9–32.4)
MEAN CORPUSCULAR VOLUME: 71.9 fL — ABNORMAL LOW (ref 77.6–95.7)
MEAN PLATELET VOLUME: 8.7 fL (ref 6.8–10.7)
MONOCYTES ABSOLUTE COUNT: 0.5 10*9/L (ref 0.3–0.8)
MONOCYTES RELATIVE PERCENT: 5.9 %
NEUTROPHILS ABSOLUTE COUNT: 4.9 10*9/L (ref 1.8–7.8)
NEUTROPHILS RELATIVE PERCENT: 58.8 %
PLATELET COUNT: 335 10*9/L (ref 150–450)
RED BLOOD CELL COUNT: 4.47 10*12/L (ref 3.95–5.13)
RED CELL DISTRIBUTION WIDTH: 19.5 % — ABNORMAL HIGH (ref 12.2–15.2)
WBC ADJUSTED: 8.3 10*9/L (ref 3.6–11.2)

## 2021-08-29 LAB — FOLATE: FOLATE: 3.2 ng/mL — ABNORMAL LOW (ref >=5.4)

## 2021-08-29 LAB — VITAMIN B12: VITAMIN B-12: 309 pg/mL (ref 211–911)

## 2021-08-29 NOTE — Unmapped (Signed)
[name], you have been seen for Anemia.    Your CBC shows low hemoglobin.  Your iron studies show low iron.    Lab Results   Component Value Date    WBC 11.4 (H) 07/19/2021    HGB 10.6 (L) 07/19/2021    HCT 34.9 07/19/2021    PLT 311 07/19/2021       Given your lab results, we will plan for IV iron - Feraheme.  We will call you to schedule this.    If proceeding with IV iron, we will plan for:  IV Ferumoxytol Duncan Dull) which is given in a 15 minute infusion.  This can be given at our infusion center and the visit usually takes approximately 2 hours.  You may need to come back for infusions more than one time.    You should expect a call within the next 2 weeks to schedule your infusion.  If you do not hear within 2 weeks, please contact our office.    As discussed, IV iron infusion does have a risk of anaphylactic reaction.  Because of this, you will receive premedications with Benadryl and Tylenol prior to infusion.  You may also experience muscle or bone pain for a few days following the infusion.    Continue oral iron supplements as tolerated. You can decrease this to 1 tab every other day. It is best if taken with Vitamin C, and not with dairy products. Recent research suggests that taking 1 tab of iron every other day is just as effective as taking iron daily or twice daily, with fewer GI side effects.     Please also continue to follow up with GYN for treatment of your heavy menstrual bleeding.    We will see you back in clinic in 3 months for a repeat lab check and follow-up visit.

## 2021-08-30 DIAGNOSIS — D500 Iron deficiency anemia secondary to blood loss (chronic): Principal | ICD-10-CM

## 2021-09-13 NOTE — Unmapped (Unsigned)
The [facility] has made a second and final attempt to reach this patient to refill the following medication:Humira.      We have left voicemails on the following phone numbers: [phone] and [phone] and have sent a MyChart message.    Dates contacted: 10/14 and 10/18  Last scheduled delivery: 9/27    The patient may be at risk of non-compliance with this medication. The patient should call the [facility] at [phone]  Option 4, then Option 2 (all other specialty patients) to refill medication.    Maryjayne Kleven D Administrator [facility] Specialty Technician

## 2021-09-14 ENCOUNTER — Ambulatory Visit: Admit: 2021-09-14 | Discharge: 2021-09-15 | Payer: PRIVATE HEALTH INSURANCE

## 2021-10-02 DIAGNOSIS — D500 Iron deficiency anemia secondary to blood loss (chronic): Principal | ICD-10-CM

## 2021-10-30 DIAGNOSIS — D500 Iron deficiency anemia secondary to blood loss (chronic): Principal | ICD-10-CM

## 2021-11-27 DIAGNOSIS — D500 Iron deficiency anemia secondary to blood loss (chronic): Principal | ICD-10-CM

## 2021-11-30 NOTE — Unmapped (Unsigned)
Hematology Consult Note    Referring Provider:  Tester, Murray Hodgkins, *    Primary Care Provider:   Filomena Jungling, [facility]    Reason for Consult:  Anemia    Assessment/Recommendations:   Kelly May is a 41 y.o. *** female with a history of *** who we are seeing in consultation at the request of [employer] for further evaluation of anemia.    1. Anemia, {anemia type micro normo macro:63979}:  Since *** last visit on ***, ***. Subjectively, ***. At a prior visit, we determined the most likely cause of iron deficiency anemia is ***.    Most recent labs from *** show ***. Based on these labs, iron deficit is ***.  Discussed with the patient my recommendation for IV iron replacement with {Iron Replacement:63981}.  Reviewed risks and benefits of IV iron including the risk of anaphylaxis.  Process for administration was reviewed including premedications and monitoring. All questions were answered and the patient was in agreement with this plan. See detailed HPI and interval history below for further information.         Plan:  - Labs collected today: {anemia labs:63985}  - Plan for IV iron with {Iron Replacement:63981} - *** mg to be given over {numberlist:31955} infusions.   - Continue OTC oral iron supplements as tolerated. Take 1 adult dose every other day. It is best if taken with Vitamin C, and not with dairy products or products that contain caffeine. Some formulations that have been better tolerated than others include: Ferrex, Slow Fe, or any of the liquid iron supplements. ***  - Will hold off on IV iron supplementation at this time.***  - Return in {number of months:64397} for repeat labs and assessment.    History of Present Illness:   ***    Interval History:  Since *** last visit on ***, she ***. Subjectively, ***    Today, Her energy level and concentration has been normal***. She {admits denies:63987} shortness of breath with exertion or at rest, dizziness, syncope, chest pain. She {admits denies:63987} hair loss or brittle nails. She {admits denies:63987} pica. She {admits denies:63987} RLS.      IV iron administration log:  - ***    Factors that may be associated with increased risk/severity of hypersensitivity reaction to IV iron:  History of intolerance to IV iron products {Blank single:19197::yes,no}   History of >= 3 drug/food/other allergies {Blank single:19197::yes,no}   Severe asthma/eczema   {Blank single:19197::yes,no}   Mastocytosis     {Blank single:19197::yes,no}   Severe respiratory/cardiac disease  {Blank single:19197::yes,no}   Old age (>61y)    {Blank single:19197::yes,no}   Systemic inflammatory disease (RA, SLE) {Blank single:19197::yes,no}   Anxiety over receiving this iron preparation? {Blank single:19197::yes,no}     Schedule {Iron Replacement:63981} at {[place]:76726}    Visit Diagnoses:  No diagnosis found.    Medical History:  Past Medical History:   Diagnosis Date   ??? Abnormal TSH 11/27/2019   ??? Acquired hypothyroidism 11/27/2019   ??? Acute bilateral low back pain with sciatica    ??? Anemia    ??? Anxiety    ??? COPD (chronic obstructive pulmonary disease) (CMS-HCC)    ??? Dysthymia    ??? Encounter for supervision of normal pregnancy in multigravida 11/21/2012    FWB: Weekly BPPs starting @ 32-34 weeks for BMI>40. EFW: 2731 gms 6 lbs 0 oz (52%) at 34 1/7.  EFW: 3739 gms 8 lbs 4 oz (83%) at 37 6/7 GBS: neg MOD:  Anticipate SVD. Feeding:  PP: LARC (IUD and*   ??? Graves disease    ??? Headache(784.0)    ??? Obesity    ??? OSA (obstructive sleep apnea)    ??? Sleep apnea    ??? Substance abuse (CMS-HCC)    ??? Type 2 diabetes mellitus without complication, without long-term current use of insulin (CMS-HCC) 11/27/2019       Surgical History:  Past Surgical History:   Procedure Laterality Date   ??? CHOLECYSTECTOMY         No history of bleeding complications with past surgeries***    Social History:  Social History     Socioeconomic History   ??? Marital status: Single Tobacco Use   ??? Smoking status: Former     Packs/day: 1.00     Years: 23.00     Pack years: 23.00     Types: Cigarettes     Quit date: 02/08/2021     Years since quitting: 0.8   ??? Smokeless tobacco: Never   Vaping Use   ??? Vaping Use: Never used   Substance and Sexual Activity   ??? Alcohol use: Not Currently     Comment: Never have been a heavy drinker   ??? Drug use: Never     Types: Marijuana   ??? Sexual activity: Not Currently     Partners: Male     Birth control/protection: Abstinence   Other Topics Concern   ??? Exercise No   ??? Living Situation No   ??? Do you use sunscreen? Yes   ??? Tanning bed use? Yes   ??? Are you easily burned? No   ??? Excessive sun exposure? Yes   ??? Blistering sunburns? Yes     Social Determinants of Health     Financial Resource Strain: High Risk   ??? Difficulty of Paying Living Expenses: Very hard       Kelly May was born in *** and currently lives in *** with ***.  Works ***.  Hobbies include ***.     Tobacco history: ***  Alcohol history: ***  Illicit drug history: ***    Family History:  family history includes Asthma in her brother and brother; COPD in her father and maternal grandfather; Diabetes in her maternal aunt, maternal aunt, maternal uncle, and maternal uncle; Hypertension in her mother; Osteoporosis in her maternal grandmother and mother; Stroke in her maternal uncle; Tremor in her mother.     No family history of iron deficiency anemia, bleeding or clotting disorders***    Allergies:  Patient has no known allergies.    Medications:   Current Outpatient Medications   Medication Sig Dispense Refill   ??? buPROPion (WELLBUTRIN XL) 300 MG 24 hr tablet Take 1 tablet (300 mg total) by mouth every morning. 90 tablet 3   ??? empty container (SHARPS CONTAINER) Misc Use as directed 1 each 2   ??? ferrous fumarate 324 mg (106 mg iron) Tab Take 1 tablet (324 mg total) by mouth in the morning. 90 tablet 1   ??? fluticasone propionate (FLONASE) 50 mcg/actuation nasal spray 1 spray into each nostril in the morning. 16 g 11   ??? HUMIRA PEN CITRATE FREE 40 MG/0.4 ML Inject the contents of 2 pens (80 mg total) under the skin once a week. 8 each 4   ??? hydroCHLOROthiazide (MICROZIDE) 12.5 mg capsule Take 1 capsule (12.5 mg total) by mouth every morning. 30 capsule 11   ??? liraglutide (VICTOZA 3-PAK) 0.6 mg/0.1 mL (18 mg/3  mL) injection Inject 0.1 mL (0.6 mg total) under the skin daily for 7 days, THEN 0.2 mL (1.2 mg total) daily. 18 mL 1   ??? metFORMIN (GLUCOPHAGE-XR) 500 MG 24 hr tablet Take 1 tablet (500 mg total) by mouth in the morning and 1 tablet (500 mg total) in the evening. Take with meals. 180 tablet 3   ??? methocarbamoL (ROBAXIN) 750 MG tablet Take 1 tablet by mouth three times daily as needed 60 tablet 0   ??? naproxen (NAPROSYN) 500 MG tablet Take 1 tablet (500 mg total) by mouth two (2) times a day as needed. TAKE with food 60 tablet 1   ??? norgestimate-ethinyl estradioL (ORTHO-CYCLEN) 0.25-35 mg-mcg per tablet Take 1 tablet by mouth daily. (Patient not taking: No sig reported) 84 tablet 3   ??? pen needle, diabetic 32 gauge x 1/4 (6 mm) Ndle Inject 1 each under the skin daily. 100 each 3   ??? PROAIR HFA 90 mcg/actuation inhaler Inhale 2 puffs every four (4) hours as needed for wheezing. 16 g 2     No current facility-administered medications for this visit.         Review of Systems:  As per HPI, otherwise negative x 12 systems.    Objective :  There were no vitals filed for this visit.    Physical Exam:  GEN: Well-appearing female in no acute distress.  HEENT: MMM, normocephalic, no conjunctival injection, sclera anicteric.  CV: RRR, No murmurs, rubs, or gallops.  RESP: CTAB, breathing unlabored, no wheezes, rhonchi.  GI: Abdomen soft, round, nondistended, nontender to palpation.    NEURO: A&Ox3, CNII-XII grossly intact.  PSY: Appropriate affect, reasonable insight and judgment.  Extremities: Bilateral extremities without swelling or edema, equal in size, without erythema.  Skin: Dry, warm, no rashes.    Test Results  Lab Results   Component Value Date    WBC 8.3 08/29/2021    HGB 9.9 (L) 08/29/2021    HCT 32.2 (L) 08/29/2021    PLT 335 08/29/2021          Lab Results   Component Value Date    IRON <2 (L) 07/19/2021    TIBC 389.3 07/19/2021    FERRITIN 7.9 07/19/2021        Results for orders placed or performed in visit on 08/29/21   Vitamin B12 Level   Result Value Ref Range    Vitamin B-12 309 211 - 911 pg/ml   Folate Level   Result Value Ref Range    Folate 3.2 (L) >=5.4 ng/mL   CBC w/ Differential   Result Value Ref Range    WBC 8.3 3.6 - 11.2 10*9/L    RBC 4.47 3.95 - 5.13 10*12/L    HGB 9.9 (L) 11.3 - 14.9 g/dL    HCT 91.4 (L) 78.2 - 44.0 %    MCV 71.9 (L) 77.6 - 95.7 fL    MCH 22.1 (L) 25.9 - 32.4 pg    MCHC 30.8 (L) 32.0 - 36.0 g/dL    RDW 95.6 (H) 21.3 - 15.2 %    MPV 8.7 6.8 - 10.7 fL    Platelet 335 150 - 450 10*9/L    Neutrophils % 58.8 %    Lymphocytes % 31.4 %    Monocytes % 5.9 %    Eosinophils % 3.6 %    Basophils % 0.3 %    Absolute Neutrophils 4.9 1.8 - 7.8 10*9/L    Absolute Lymphocytes 2.6 1.1 - 3.6 10*9/L  Absolute Monocytes 0.5 0.3 - 0.8 10*9/L    Absolute Eosinophils 0.3 0.0 - 0.5 10*9/L    Absolute Basophils 0.0 0.0 - 0.1 10*9/L    Microcytosis Slight (A) Not Present    Anisocytosis Moderate (A) Not Present       I personally spent *** minutes face-to-face and non-face-to-face in the care of the patient on the date of service.  This included taking a history, examining the patient, reviewing records, developing and discussing the management plan, documentation, and communications with other health care professionals. More than 50% of the time was spent on counseling the patient and coordinating care.     Glendell Docker, NP  [facility]

## 2021-12-06 DIAGNOSIS — R0602 Shortness of breath: Principal | ICD-10-CM

## 2021-12-06 MED ORDER — ALBUTEROL SULFATE HFA 90 MCG/ACTUATION INHALER T-HOME
RESPIRATORY_TRACT | 2 refills | 0 days | Status: CP | PRN
Start: 2021-12-06 — End: 2022-12-06

## 2021-12-06 NOTE — Unmapped (Signed)
Fax from [employer],    ProAir is no longer available they would like to change it to ventolin?

## 2021-12-07 NOTE — Unmapped (Signed)
Order for generic albuterol HFA placed.

## 2021-12-22 ENCOUNTER — Emergency Department
Admission: EM | Admit: 2021-12-22 | Discharge: 2021-12-22 | Disposition: A | Discharge status: Home / Self Care | Payer: Medicaid Other | Attending: Emergency Medicine | Admitting: Emergency Medicine

## 2021-12-22 ENCOUNTER — Emergency Department: Payer: Medicaid Other

## 2021-12-22 ENCOUNTER — Other Ambulatory Visit: Payer: Self-pay

## 2021-12-22 DIAGNOSIS — R809 Proteinuria, unspecified: Secondary | ICD-10-CM | POA: Diagnosis not present

## 2021-12-22 DIAGNOSIS — N3090 Cystitis, unspecified without hematuria: Secondary | ICD-10-CM

## 2021-12-22 DIAGNOSIS — E119 Type 2 diabetes mellitus without complications: Secondary | ICD-10-CM | POA: Insufficient documentation

## 2021-12-22 DIAGNOSIS — N3000 Acute cystitis without hematuria: Secondary | ICD-10-CM | POA: Diagnosis not present

## 2021-12-22 DIAGNOSIS — R110 Nausea: Secondary | ICD-10-CM | POA: Insufficient documentation

## 2021-12-22 DIAGNOSIS — R350 Frequency of micturition: Secondary | ICD-10-CM | POA: Diagnosis present

## 2021-12-22 HISTORY — DX: Type 2 diabetes mellitus without complications: E11.9

## 2021-12-22 LAB — URINALYSIS, COMPLETE (UACMP) WITH MICROSCOPIC
Bilirubin Urine: NEGATIVE
Glucose, UA: NEGATIVE mg/dL
Ketones, ur: NEGATIVE mg/dL
Nitrite: NEGATIVE
Protein, ur: 30 mg/dL — AB
RBC / HPF: >50 RBC/hpf (ref 0–5)
Specific Gravity, Urine: 1.02 (ref 1.005–1.030)
WBC, UA: >50 WBC/hpf (ref 0–5)
pH: 5.5 (ref 5.0–8.0)

## 2021-12-22 MED ORDER — CEPHALEXIN 500 MG PO CAPS: 500.0000 mg | ORAL_CAPSULE | Freq: Four times a day (QID) | ORAL | 0 refills | Status: AC

## 2021-12-22 MED ORDER — KETOROLAC TROMETHAMINE 30 MG/ML IJ SOLN
15.0000 mg | Freq: Once | INTRAMUSCULAR | Status: AC
  Administered 2021-12-22: 15 mg via INTRAMUSCULAR
  Filled 2021-12-22: qty 1

## 2021-12-22 MED ORDER — ONDANSETRON 4 MG PO TBDP
4.0000 mg | ORAL_TABLET | Freq: Once | ORAL | Status: AC
  Administered 2021-12-22: 4 mg via ORAL
  Filled 2021-12-22: qty 1

## 2021-12-22 MED ORDER — ONDANSETRON 4 MG PO TBDP: 4.0000 mg | ORAL_TABLET | Freq: Three times a day (TID) | ORAL | 0 refills | Status: AC | PRN

## 2021-12-22 NOTE — ED Triage Notes (Signed)
Pt to ED for urinary frequency and burning with urination that started 4 days ago. Currently on menstrual cycle.

## 2021-12-22 NOTE — ED Provider Notes (Signed)
[facility] Provider Note    Event Date/Time   First MD Initiated Contact with Patient 12/22/21 1118     (approximate)   History   Chief Complaint Urinary Frequency   HPI [name] [name] is a 41 y.o. female, history of diabetes, presents emergency department for evaluation of dysuria.  Patient reports urinary frequency and burning sensation while urinating that started approximately 4 days ago.  Patient states that she also feels increased resistance while urinating and feels pressure in her urethra.  She says she has had urinary tract infections in the past, but this feels like some different.  Additionally endorses some mild nausea.  Denies fever/chills, chest pain, abdominal pain, back/flank pain, vomiting, or diarrhea.  Patient states that she has not sexually active and is not concerned for pregnancy or STDs.  Denies any vaginal discharge.  History Limitations: No limitations.      Physical Exam  Triage Vital Signs: ED Triage Vitals  Enc Vitals Group     BP 12/22/21 1105 (!) 145/83     Pulse Rate 12/22/21 1105 (!) 104     Resp 12/22/21 1103 20     Temp 12/22/21 1103 97.9 F (36.6 C)     Temp Source 12/22/21 1103 Oral     SpO2 12/22/21 1105 98 %     Weight 12/22/21 1105 (!) 370 lb (167.8 kg)     Height 12/22/21 1105 5\' 4"  (1.626 m)     Head Circumference --      Peak Flow --      Pain Score 12/22/21 1105 7     Pain Loc --      Pain Edu? --      Excl. in [place]? --     Most recent vital signs: Vitals:   12/22/21 1103 12/22/21 1105  BP:  (!) 145/83  Pulse:  (!) 104  Resp: 20   Temp: 97.9 F (36.6 C)   SpO2:  98%    General: Awake, no distress.  CV:  Good peripheral perfusion.  Resp:  Normal effort.  Lung sounds clear bilaterally. Abd:  Abdomen is soft and nontender.  No CVA tenderness bilaterally. Other:    Physical Exam    ED Results / Procedures / Treatments  Labs (all labs ordered are listed, but only abnormal results are  displayed) Labs Reviewed  URINALYSIS, COMPLETE (UACMP) WITH MICROSCOPIC - Abnormal; Notable for the following components:      Result Value   APPearance CLOUDY (*)    Hgb urine dipstick LARGE (*)    Protein, ur 30 (*)    Leukocytes,Ua MODERATE (*)    Bacteria, UA FEW (*)    All other components within normal limits  URINE CULTURE     EKG Not applicable.   RADIOLOGY  ED Provider Interpretation: I personally reviewed these images.  Symmetric wall thickening suggestive of cystitis.  CT Renal Stone Study  Result Date: 12/22/2021 CLINICAL DATA:  Urinary frequency and burning with urination x4 days EXAM: CT ABDOMEN AND PELVIS WITHOUT CONTRAST TECHNIQUE: Multidetector CT imaging of the abdomen and pelvis was performed following the standard protocol without IV contrast. RADIATION DOSE REDUCTION: This exam was performed according to the departmental dose-optimization program which includes automated exposure control, adjustment of the mA and/or kV according to patient size and/or use of iterative reconstruction technique. COMPARISON:  Abdominal ultrasound January 06, 2015. FINDINGS: Lower chest: No acute abnormality. Hepatobiliary: No focal suspicious lesion on this noncontrast CT. Gallbladder surgically absent. No  biliary ductal dilation. Pancreas: No pancreatic ductal dilation or evidence of acute inflammation. Spleen: Normal in size without focal abnormality. Adrenals/Urinary Tract: Bilateral adrenal glands appear normal. No hydronephrosis. No renal, ureteral or bladder calculi identified. Symmetric wall thickening of an incompletely distended urinary bladder. Stomach/Bowel: No enteric contrast was administered. Stomach is predominantly decompressed limiting evaluation. No pathologic dilation small or large bowel. The appendix and terminal ileum appear normal. Colonic diverticulosis without findings of acute diverticulitis. Vascular/Lymphatic: Minimal aortic atherosclerosis without aneurysmal  dilation. Prominent porta hepatis lymph nodes measuring up to 7 mm, favored reactive. No pathologically enlarged abdominal or pelvic lymph nodes. Reproductive: Unremarkable premenopausal noncontrast CT appearance of the uterus and adnexa. Other: No significant abdominopelvic free fluid. Nonspecific subcutaneous edema. Musculoskeletal: No acute osseous abnormality. IMPRESSION: 1. Symmetric wall thickening of an incompletely distended urinary bladder. Correlate with urinalysis to exclude cystitis. 2. Colonic diverticulosis without findings of acute diverticulitis. Electronically Signed   By: Dahlia Bailiff M.D.   On: 12/22/2021 12:38    PROCEDURES:  Critical Care performed: None.  Procedures    MEDICATIONS ORDERED IN ED: Medications  ondansetron (ZOFRAN-ODT) disintegrating tablet 4 mg (4 mg Oral Given 12/22/21 1158)  ketorolac (TORADOL) 30 MG/ML injection 15 mg (15 mg Intramuscular Given 12/22/21 1158)     IMPRESSION / MDM / ASSESSMENT AND PLAN / ED COURSE  I reviewed the triage vital signs and the nursing notes.                              [name] [name] is a 41 y.o. female, history of diabetes, presents emergency department for evaluation of dysuria.  Patient reports urinary frequency and burning sensation while urinating that started approximately 4 days ago.   Differential diagnosis includes, but is not limited to, cystitis, pyelonephritis, nephrolithiasis, urethritis.  ED Course Urinalysis significant for large hemoglobin, proteinuria, moderate leukocytes, and few bacteria, possibly suggestive of nephrolithiasis versus UTI.  Will order renal stone study and urine culture.  Given patient's endorsement of pain and nausea, we will go ahead and treat with ketorolac 15 mg and ondansetron 4 mg.  CT renal stone study shows symmetric bladder wall thickness, suggestive of cystitis.    Assessment/Plan Patient's history, physical exam, and work-up thus far is consistent with cystitis.  Imaging  is reassuring for rule out of nephrolithiasis.  We will go ahead and prescribe cephalexin and ondansetron.  Given the patient's vitals and presentation, I do not suspect that she needs admission.  Will advise her to follow-up with her outpatient PCP as needed.  Patient was provided with anticipatory guidance, return precautions, and educational material. Encouraged the patient to return to the emergency department at any time if they begin to experience any new or worsening symptoms.       FINAL CLINICAL IMPRESSION(S) / ED DIAGNOSES   Final diagnoses:  Cystitis     Rx / DC Orders   ED Discharge Orders          Ordered    cephALEXin (KEFLEX) 500 MG capsule  4 times daily        12/22/21 1302    ondansetron (ZOFRAN-ODT) 4 MG disintegrating tablet  Every 8 hours PRN        12/22/21 1302            Note:  This document was prepared using Dragon voice recognition software and may include unintentional dictation errors.   Teodoro Spray, [place] 12/22/21 1940  Lucrezia Starch, MD 12/22/21 [phone]

## 2021-12-22 NOTE — Discharge Instructions (Addendum)
-  Take your antibiotics as prescribed.  Utilize ondansetron as needed to manage nausea. -Return to the emergency department anytime if you begin to experience any new or worsening symptoms. -Follow-up with your primary care provider if your symptoms fail to improve.

## 2021-12-23 LAB — URINE CULTURE: Culture: <10000 — AB

## 2021-12-23 NOTE — Unmapped (Signed)
Called, no answer    Thanks!

## 2021-12-23 NOTE — Unmapped (Signed)
Please help Patient to reschedule her appt with Dr. Heloise Beecham.    Thank You!

## 2022-01-23 ENCOUNTER — Ambulatory Visit: Admit: 2022-01-23 | Discharge: 2022-01-24 | Payer: PRIVATE HEALTH INSURANCE

## 2022-01-23 DIAGNOSIS — N939 Abnormal uterine and vaginal bleeding, unspecified: Principal | ICD-10-CM

## 2022-01-24 ENCOUNTER — Ambulatory Visit: Admit: 2022-01-24 | Discharge: 2022-01-24 | Payer: PRIVATE HEALTH INSURANCE

## 2022-01-24 MED ADMIN — acetaminophen (TYLENOL) tablet 650 mg: 650 mg | ORAL | @ 20:00:00 | Stop: 2022-01-24

## 2022-01-24 MED ADMIN — cetirizine (ZyrTEC) tablet 10 mg: 10 mg | ORAL | @ 20:00:00 | Stop: 2022-01-24

## 2022-01-24 MED ADMIN — ferumoxytoL (FERAHEME) 510 mg in sodium chloride (NS) 0.9 % 100 mL IVPB: 510 mg | INTRAVENOUS | @ 20:00:00 | Stop: 2022-01-24

## 2022-01-24 NOTE — Unmapped (Signed)
Pt presents for Feraheme infusion, VSS.  IV placed, premeds administered.  Pt aware of potential reaction/side effects, call bell within reach.  1513 Feraheme 510mg  started  1528 Infusion complete.  Pt tolerated without complication, VSS. IV flushed per policy and d/c'd, gauze and coban applied.  Pt left clinic in no acute distress.

## 2022-01-24 NOTE — Unmapped (Signed)
Outpatient Gynecology Note - Established Patient    ASSESSMENT AND PLAN     Abnormal uterine bleeding (AUB)  - Ambulatory referral to Obstetrics / Gynecology  - Surgical pathology exam  -Hx of Type 2 DM-currently on metformin. Followed by Endocrinology  -Hx of Anemia-followed by Hematology  -Hx of Graves Disease-currently no medication; was followed by Endocrinology  -Pelvic US: 04/2021 results:  Heterogeneous myometrium  demonstrating posterior enhancement. Findings can be seen in adenomyosis.  ??Thickened endometrium measuring 2.4 cm which is not specific but could also be associated with endometrial hyperplasia. However, a followup ultrasound could also be performed following the completion of menstruation. Alternatively, saline infused sonohysterography could also be performed.   -If EMB normal, can follow up with Mirena IUD for management of heavy periods/AUB and protection of endometrial lining   ??    Return in about 4 weeks (around 02/20/2022) for Mirena IUD insertion.      SUBJECTIVE     This 41 y.o. [id number] is an established [facility] patient who presents complaining of heavy irregular menstrual bleeding, unscheduled, sometimes lasting 5-30 days in duration, with severe abdominal cramping and back pain..  This has been a problem for the past year.  She denies fagitue, shortness of breath, dizziness, PICA.  However, she is scheduled to see hematology due to history of anemia.     OB History   Gravida Para Term Preterm AB Living   4 3 3  0 1 3   SAB IAB Ectopic Molar Multiple Live Births   1         3      # Outcome Date GA Lbr Len/2nd Weight Sex Delivery Anes PTL Lv   4 Term 02/2014    F Vag-Spont   LIV   3 Term 04/12/13 [redacted]w[redacted]d  3551 g (7 lb 13.3 oz) F Vag-Spont None  LIV      Complications: Obesity (BMI 35.0-39.9 without comorbidity)   2 SAB 2013           1 Term 2001 [redacted]w[redacted]d  3600 g (7 lb 15 oz)  Vag-Spont   LIV       GYN History:   Menstrual cycles: 5-30 days with most days of heavy flow and severe cramping   Last Pap: 03/2020  NML, HPV neg  Abnormal Pap hx: hx of colposcopy for abnormal pap with HPV as per pt  STI history: negative  Gender of sexual partners: male  Contraceptive method: None    Past medical and surgical history was reviewed and updated.    Current Outpatient Medications   Medication Sig Dispense Refill   ??? albuterol (PROVENTIL HFA;VENTOLIN HFA) 90 mcg/actuation inhaler Inhale 2 puffs every four (4) hours as needed for wheezing or shortness of breath. 8 each 2   ??? buPROPion (WELLBUTRIN XL) 300 MG 24 hr tablet Take 1 tablet (300 mg total) by mouth every morning. 90 tablet 3   ??? empty container (SHARPS CONTAINER) Misc Use as directed 1 each 2   ??? ferrous fumarate 324 mg (106 mg iron) Tab Take 1 tablet (324 mg total) by mouth in the morning. 90 tablet 1   ??? fluticasone propionate (FLONASE) 50 mcg/actuation nasal spray 1 spray into each nostril in the morning. 16 g 11   ??? HUMIRA PEN CITRATE FREE 40 MG/0.4 ML Inject the contents of 2 pens (80 mg total) under the skin once a week. 8 each 4   ??? liraglutide (VICTOZA 3-PAK) 0.6 mg/0.1 mL (18 mg/3 mL) injection  Inject 0.1 mL (0.6 mg total) under the skin daily for 7 days, THEN 0.2 mL (1.2 mg total) daily. 18 mL 1   ??? metFORMIN (GLUCOPHAGE-XR) 500 MG 24 hr tablet Take 1 tablet (500 mg total) by mouth in the morning and 1 tablet (500 mg total) in the evening. Take with meals. 180 tablet 3   ??? methocarbamoL (ROBAXIN) 750 MG tablet Take 1 tablet by mouth three times daily as needed 60 tablet 0   ??? naproxen (NAPROSYN) 500 MG tablet Take 1 tablet (500 mg total) by mouth two (2) times a day as needed. TAKE with food 60 tablet 1   ??? norgestimate-ethinyl estradioL (ORTHO-CYCLEN) 0.25-35 mg-mcg per tablet Take 1 tablet by mouth daily. 84 tablet 3   ??? pen needle, diabetic 32 gauge x 1/4 (6 mm) Ndle Inject 1 each under the skin daily. 100 each 3   ??? hydroCHLOROthiazide (MICROZIDE) 12.5 mg capsule Take 1 capsule (12.5 mg total) by mouth every morning. (Patient not taking: Reported on 01/23/2022) 30 capsule 11     No current facility-administered medications for this visit.       No Known Allergies    REVIEW OF SYSTEMS: See HPI; remaining balance of 10 systems are negative/non-contributory.    OBJECTIVE     BP 116/85  - Pulse 88  - Wt (!) 167.3 kg (368 lb 14.4 oz)  - LMP 12/12/2021 (Exact Date)  - BMI 60.00 kg/m??   General: morbid-obesed and in NAD  Psych: pleasant and interactive, answers questions appropriately  Ext genitalia: sebaceous cysts on right labia  Vagina: no gross lesions or abnormal discharge.  No bleeding  Cervix are without gross lesions  Uterus:  normal size and non-tender  Adnexa: No tenderness or palpable masses    Endometrial biopsy:  Procedure explained to the patient in detail including but not limited to infection, uterine perforation, damage to surrounding structures and insufficient tissue obtained deeming sample inconclusive.  Written consent was obtained.  Procedure time out performed.  Speculum placed in vagina allowing for full visualization of the cervix.  Bimanual exam done prior to beginning the procedure noted above.  Betadine applied to the cervix.  Tenaculum placed.  EMB pipelle inserted with ease with uterine sound measuring 8 cm.  Biopsy specimen was sufficient and placed in biopsy jar.  Pt instructed on aftercare instructions and precautions to contact the office with to include pelvic pain not relieved with OTC analgesics, fever, foul smelling discharge or fever.      LABS     Results for orders placed or performed in visit on 01/23/22   POCT Pregnancy Urine, Qualitative   Result Value Ref Range    HCG Urine, POC Negative Negative

## 2022-01-27 DIAGNOSIS — N939 Abnormal uterine and vaginal bleeding, unspecified: Principal | ICD-10-CM

## 2022-01-27 MED ORDER — MEDROXYPROGESTERONE 10 MG TABLET
ORAL_TABLET | Freq: Every day | ORAL | 2 refills | 30.00000 days | Status: CP
Start: 2022-01-27 — End: ?

## 2022-01-27 NOTE — Unmapped (Signed)
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    Discussed treatment and management:  Start on progestin continuously with provera 20 mg daily and can titrate upwards to 30 mg daily if needed   IUD insertion scheduled 03/13/22  Repeat EMB in 6 months    All questions and concerns addressed  Rx'd provera 20 mg daily

## 2022-01-31 ENCOUNTER — Ambulatory Visit: Admit: 2022-01-31 | Discharge: 2022-02-01 | Payer: PRIVATE HEALTH INSURANCE

## 2022-01-31 MED ADMIN — acetaminophen (TYLENOL) tablet 650 mg: 650 mg | ORAL | @ 20:00:00 | Stop: 2022-01-31

## 2022-01-31 MED ADMIN — ferumoxytoL (FERAHEME) 510 mg in sodium chloride (NS) 0.9 % 100 mL IVPB: 510 mg | INTRAVENOUS | @ 20:00:00 | Stop: 2022-01-31

## 2022-01-31 MED ADMIN — cetirizine (ZyrTEC) tablet 10 mg: 10 mg | ORAL | @ 20:00:00 | Stop: 2022-01-31

## 2022-01-31 NOTE — Unmapped (Signed)
Pt presents for Feraheme infusion, VSS.  IV placed, premeds administered.  Pt aware of potential reaction/side effects, call bell within reach.  1505 Feraheme 510 mg started  1529 Infusion complete. Pt remained 15 min post infusion.  Pt tolerated without complication, VSS. IV flushed per policy and d/c'd, gauze and coban applied.  Pt left clinic in no acute distress.

## 2022-02-07 ENCOUNTER — Ambulatory Visit: Admit: 2022-02-07 | Discharge: 2022-02-08 | Payer: PRIVATE HEALTH INSURANCE

## 2022-02-07 MED ADMIN — cetirizine (ZyrTEC) tablet 10 mg: 10 mg | ORAL | @ 19:00:00 | Stop: 2022-02-07

## 2022-02-07 MED ADMIN — ferumoxytoL (FERAHEME) 510 mg in sodium chloride (NS) 0.9 % 100 mL IVPB: 510 mg | INTRAVENOUS | @ 19:00:00 | Stop: 2022-02-07

## 2022-02-07 MED ADMIN — acetaminophen (TYLENOL) tablet 650 mg: 650 mg | ORAL | @ 19:00:00 | Stop: 2022-02-07

## 2022-02-07 NOTE — Unmapped (Signed)
1445 Patient in today for Feraheme infusion. Patient has no s/s of infection. No issues from the last infusion.3 of 4  1527 Feraheme started ,patient instructed to use call bell /call nurse for any s/s of unsual symptoms during infusion. Patient educated on possible s/s of reaction,such as chestpain ,itching,shortness of breath lightheadedness and any kind of discomfort. Patient verbalized understanding.  1554 Infusion completed and well tolerated.  1615 Feraheme 510 mg/ 100 ml NS infused over 27 min. per protocol . Pt alert and oriented, offered no complaints during infusion. IV flushed with 10ml NS post infusion. Pt tolerated infusion well.

## 2022-02-14 ENCOUNTER — Ambulatory Visit: Admit: 2022-02-14 | Discharge: 2022-02-15 | Payer: PRIVATE HEALTH INSURANCE

## 2022-02-14 MED ADMIN — acetaminophen (TYLENOL) tablet 650 mg: 650 mg | ORAL | @ 19:00:00 | Stop: 2022-02-14

## 2022-02-14 MED ADMIN — cetirizine (ZyrTEC) tablet 10 mg: 10 mg | ORAL | @ 19:00:00 | Stop: 2022-02-14

## 2022-02-14 MED ADMIN — ferumoxytoL (FERAHEME) 510 mg in sodium chloride (NS) 0.9 % 100 mL IVPB: 510 mg | INTRAVENOUS | @ 19:00:00 | Stop: 2022-02-14

## 2022-02-14 NOTE — Unmapped (Signed)
1445 Patient in today for Feraheme infusion. Patient has no s/s of infection. No issues from the last infusion 4 of 4  1510 Feraheme started ,patient instructed to use call bell /call nurse for any s/s of unsual symptoms during infusion. Patient educated on possible s/s of reaction,such as chestpain ,itching,shortness of breath lightheadedness and any kind of discomfort. Patient verbalized understanding.  1534 Infusion completed and well tolerated.  1555 Feraheme 510 mg/ 100 ml NS infused over 24 min. per protocol . Pt alert and oriented, offered no complaints during infusion. IV flushed with 10ml NS post infusion. Pt tolerated infusion well.

## 2022-02-15 NOTE — Unmapped (Signed)
Specialty Medication(s): Humira    Kelly May has been dis-enrolled from the [facility] specialty pharmacy services due to multiple unsuccessful outreach attempts by the pharmacy.    Additional information provided to the patient: no derm visits scheduled/last 2 were cancelled.    [name]  [facility] Specialty Pharmacist

## 2022-02-24 ENCOUNTER — Ambulatory Visit
Admit: 2022-02-24 | Payer: PRIVATE HEALTH INSURANCE | Attending: Rehabilitative and Restorative Service Providers" | Primary: Rehabilitative and Restorative Service Providers"

## 2022-03-01 DIAGNOSIS — G8929 Other chronic pain: Principal | ICD-10-CM

## 2022-03-01 DIAGNOSIS — M545 Chronic low back pain without sciatica, unspecified back pain laterality: Principal | ICD-10-CM

## 2022-03-01 MED ORDER — METHOCARBAMOL 750 MG TABLET
ORAL_TABLET | 0 refills | 0 days
Start: 2022-03-01 — End: ?

## 2022-03-01 MED ORDER — NAPROXEN 500 MG TABLET
ORAL_TABLET | 0 refills | 0 days
Start: 2022-03-01 — End: ?

## 2022-03-02 MED ORDER — NAPROXEN 500 MG TABLET
ORAL_TABLET | 0 refills | 0 days | Status: CP
Start: 2022-03-02 — End: ?

## 2022-03-02 MED ORDER — METHOCARBAMOL 750 MG TABLET
ORAL_TABLET | 0 refills | 0 days | Status: CP
Start: 2022-03-02 — End: ?

## 2022-03-13 ENCOUNTER — Ambulatory Visit: Admit: 2022-03-13 | Discharge: 2022-03-14 | Payer: PRIVATE HEALTH INSURANCE

## 2022-03-13 DIAGNOSIS — Z30430 Encounter for insertion of intrauterine contraceptive device: Principal | ICD-10-CM

## 2022-04-06 ENCOUNTER — Ambulatory Visit: Admit: 2022-04-06 | Discharge: 2022-04-07 | Payer: PRIVATE HEALTH INSURANCE | Attending: Medical | Primary: Medical

## 2022-04-06 DIAGNOSIS — G4733 Obstructive sleep apnea (adult) (pediatric): Principal | ICD-10-CM

## 2022-04-06 DIAGNOSIS — E0500 Thyrotoxicosis with diffuse goiter without thyrotoxic crisis or storm: Principal | ICD-10-CM

## 2022-04-06 DIAGNOSIS — E119 Type 2 diabetes mellitus without complications: Principal | ICD-10-CM

## 2022-04-06 DIAGNOSIS — F418 Other specified anxiety disorders: Principal | ICD-10-CM

## 2022-04-06 DIAGNOSIS — L732 Hidradenitis suppurativa: Principal | ICD-10-CM

## 2022-04-06 DIAGNOSIS — E559 Vitamin D deficiency, unspecified: Principal | ICD-10-CM

## 2022-04-06 DIAGNOSIS — D500 Iron deficiency anemia secondary to blood loss (chronic): Principal | ICD-10-CM

## 2022-04-06 DIAGNOSIS — D649 Anemia, unspecified: Principal | ICD-10-CM

## 2022-04-06 MED ORDER — PEN NEEDLE, DIABETIC 32 GAUGE X 1/4" (6 MM)
SUBCUTANEOUS | 3 refills | 0 days | Status: CP
Start: 2022-04-06 — End: 2023-04-06

## 2022-04-06 MED ORDER — OZEMPIC 0.25 MG OR 0.5 MG (2 MG/3 ML) SUBCUTANEOUS PEN INJECTOR
SUBCUTANEOUS | 0 refills | 56 days | Status: CP
Start: 2022-04-06 — End: 2022-06-01

## 2022-04-11 MED ORDER — ERGOCALCIFEROL (VITAMIN D2) 1,250 MCG (50,000 UNIT) CAPSULE
ORAL_CAPSULE | ORAL | 1 refills | 84 days | Status: CP
Start: 2022-04-11 — End: ?

## 2022-04-14 ENCOUNTER — Ambulatory Visit: Admit: 2022-04-14 | Discharge: 2022-04-15 | Payer: PRIVATE HEALTH INSURANCE

## 2022-04-14 DIAGNOSIS — N939 Abnormal uterine and vaginal bleeding, unspecified: Principal | ICD-10-CM

## 2022-04-14 DIAGNOSIS — Z30431 Encounter for routine checking of intrauterine contraceptive device: Principal | ICD-10-CM

## 2022-04-14 DIAGNOSIS — N8501 Benign endometrial hyperplasia: Principal | ICD-10-CM

## 2022-04-14 DIAGNOSIS — Z30430 Encounter for insertion of intrauterine contraceptive device: Principal | ICD-10-CM

## 2022-04-14 MED ORDER — TRANEXAMIC ACID 650 MG TABLET
ORAL_TABLET | Freq: Three times a day (TID) | ORAL | 0 refills | 5.00000 days | Status: CP
Start: 2022-04-14 — End: 2022-04-19

## 2022-04-26 DIAGNOSIS — N939 Abnormal uterine and vaginal bleeding, unspecified: Principal | ICD-10-CM

## 2022-04-26 MED ORDER — NORETHINDRONE ACETATE 5 MG TABLET
ORAL_TABLET | Freq: Every day | ORAL | 11 refills | 30.00000 days | Status: CP
Start: 2022-04-26 — End: ?

## 2022-04-27 ENCOUNTER — Emergency Department
Admission: EM | Admit: 2022-04-27 | Discharge: 2022-04-27 | Disposition: A | Discharge status: Home / Self Care | Payer: Medicaid Other | Attending: Emergency Medicine | Admitting: Emergency Medicine

## 2022-04-27 ENCOUNTER — Other Ambulatory Visit: Payer: Self-pay

## 2022-04-27 DIAGNOSIS — E119 Type 2 diabetes mellitus without complications: Secondary | ICD-10-CM | POA: Insufficient documentation

## 2022-04-27 DIAGNOSIS — N939 Abnormal uterine and vaginal bleeding, unspecified: Secondary | ICD-10-CM | POA: Insufficient documentation

## 2022-04-27 LAB — CBC
HCT: 32.7 % — ABNORMAL LOW (ref 36.0–46.0)
Hemoglobin: 9.9 g/dL — ABNORMAL LOW (ref 12.0–15.0)
MCH: 27.7 pg (ref 26.0–34.0)
MCHC: 30.3 g/dL (ref 30.0–36.0)
MCV: 91.6 fL (ref 80.0–100.0)
Platelets: 385 10*3/uL (ref 150–400)
RBC: 3.57 MIL/uL — ABNORMAL LOW (ref 3.87–5.11)
RDW: 16.8 % — ABNORMAL HIGH (ref 11.5–15.5)
WBC: 9.9 10*3/uL (ref 4.0–10.5)
nRBC: 0 % (ref 0.0–0.2)

## 2022-04-27 LAB — COMPREHENSIVE METABOLIC PANEL
ALT: 23 U/L (ref 0–44)
AST: 21 U/L (ref 15–41)
Albumin: 3.4 g/dL — ABNORMAL LOW (ref 3.5–5.0)
Alkaline Phosphatase: 71 U/L (ref 38–126)
Anion gap: 9 (ref 5–15)
BUN: 6 mg/dL (ref 6–20)
CO2: 21 mmol/L — ABNORMAL LOW (ref 22–32)
Calcium: 8.6 mg/dL — ABNORMAL LOW (ref 8.9–10.3)
Chloride: 106 mmol/L (ref 98–111)
Creatinine, Ser: 0.7 mg/dL (ref 0.44–1.00)
GFR, Estimated: >60 mL/min (ref >60)
Glucose, Bld: 104 mg/dL — ABNORMAL HIGH (ref 70–99)
Potassium: 4.4 mmol/L (ref 3.5–5.1)
Sodium: 136 mmol/L (ref 135–145)
Total Bilirubin: 0.5 mg/dL (ref 0.3–1.2)
Total Protein: 7.8 g/dL (ref 6.5–8.1)

## 2022-04-27 LAB — TYPE AND SCREEN
ABO/RH(D): A POS
Antibody Screen: NEGATIVE

## 2022-04-27 NOTE — ED Provider Notes (Signed)
[facility] Emergency Department Provider Note     Event Date/Time   First MD Initiated Contact with Patient 04/27/22 1620     (approximate)   History   Vaginal Bleeding   HPI  [name] [name] is a 41 y.o. female with a history of diabetes and AUB, presents to the ED for evaluation.  Patient was told to report to the ED if she continued to experience heavy bleeding.  She is currently under the care of her GYN provider at [facility]  She completed a 5-day course of TXA about 3 days prior.  Before that the patient had an IUD placed in March, but apparently lost the IUD with the passage of a large clot about 3 weeks ago.  Chart review, reveals that the patient has a recent prescription for a 5 mg norethindrone contraceptive pill, but the patient has not started that prescription as of today.  She also has been in discussions with her nurse midwife for referral to the physician in the clinic for probable uterine ablation procedure.  Patient denies any weakness, dizziness, nausea, vomiting, or syncope.   Physical Exam   Triage Vital Signs: ED Triage Vitals  Enc Vitals Group     BP 04/27/22 1548 (!) 123/96     Pulse Rate 04/27/22 1548 100     Resp 04/27/22 1548 18     Temp 04/27/22 1548 98.7 F (37.1 C)     Temp Source 04/27/22 1548 Oral     SpO2 04/27/22 1548 100 %     Weight 04/27/22 1549 (!) 370 lb (167.8 kg)     Height 04/27/22 1549 5\' 4"  (1.626 m)     Head Circumference --      Peak Flow --      Pain Score 04/27/22 1549 7     Pain Loc --      Pain Edu? --      Excl. in GC? --     Most recent vital signs: Vitals:   04/27/22 1548 04/27/22 1822  BP: (!) 123/96 (!) 144/87  Pulse: 100 (!) 102  Resp: 18 20  Temp: 98.7 F (37.1 C)   SpO2: 100% 99%    General Awake, no distress.  CV:  Good peripheral perfusion.  RESP:  Normal effort.  ABD:  No distention.  GU:  deferred   ED Results / Procedures / Treatments   Labs (all labs ordered  are listed, but only abnormal results are displayed) Labs Reviewed  CBC - Abnormal; Notable for the following components:      Result Value   RBC 3.57 (*)    Hemoglobin 9.9 (*)    HCT 32.7 (*)    RDW 16.8 (*)    All other components within normal limits  COMPREHENSIVE METABOLIC PANEL - Abnormal; Notable for the following components:   CO2 21 (*)    Glucose, Bld 104 (*)    Calcium 8.6 (*)    Albumin 3.4 (*)    All other components within normal limits  POC URINE PREG, ED  TYPE AND SCREEN     EKG    RADIOLOGY  I personally viewed and evaluated these images as part of my medical decision making, as well as reviewing the written report by the radiologist.  ED Provider Interpretation: }  No results found.   PROCEDURES:  Critical Care performed: No  Procedures   MEDICATIONS ORDERED IN ED: Medications - No data to display   IMPRESSION /  MDM / ASSESSMENT AND PLAN / ED COURSE  I reviewed the triage vital signs and the nursing notes.                              Differential diagnosis includes, but is not limited to, AUB, uterine fibroids, menorrhagia  Patient to the ED with a history of AUB, for ongoing vaginal bleeding.  Patient was recently evaluated by her primary GYN provider, and has completed a course of TXA as well as a recent course of progesterone.  Patient does have a pending prescription for OCP which she has not filled.  We discussed in great detail the possible treatment options with the patient.  She has previously tried Provera with limited benefit, she failed the IUD as it was dislodged and expelled secondary to her abnormal bleeding.  She also denies any significant benefit with a 5-day course of TXA.  We discussed that the OCPs would likely be a reasonable option at this time, but the patient only concern was her concern for regular daily scheduling.  We discussed the option for Depo-Provera injection, and patient will message her provider to consider if  that is an option for her.  Ultimately the patient needs to consider a uterine ablation procedure versus probable expectant hysterectomy at some point as she does not intend to increase her family at this time.  Patient's exam is that this time stable without signs of critical anemia.  She declined a pelvic exam at this time citing she did not have any other vaginal complaints.  Patient's diagnosis is consistent with AUB. Patient will be discharged home with instructions to take recently prescribed OCP along with 3 times daily dosing of ibuprofen. Patient is to follow up with her GYN provider as needed or otherwise directed. Patient is given ED precautions to return to the ED for any worsening or new symptoms.  Patient's presentation is most consistent with acute complicated illness / injury requiring diagnostic workup.  FINAL CLINICAL IMPRESSION(S) / ED DIAGNOSES   Final diagnoses:  Abnormal uterine bleeding (AUB)     Rx / DC Orders   ED Discharge Orders     None        Note:  This document was prepared using Dragon voice recognition software and may include unintentional dictation errors.    Lissa Hoard, PA-C 04/27/22 1913    Delton Prairie, MD 04/27/22 1919

## 2022-04-27 NOTE — ED Notes (Signed)
Pt states she has been having vag bleeding since March 21st- pt states she recently finished a round of TXA and that the bleeding slowed down but has come back worse than before- pt showed this RN a picture on her phone of blood on the floor that she states happened when she stood up and that she bled through a pad- pt consulted her OB who told her to come here

## 2022-04-27 NOTE — Discharge Instructions (Signed)
You should consider starting the OCP (oral contraceptive pills) provided by your GYN provider for consideration of Depo-Provera as an option. You may consider taking OTC ibuprofen (400 mg TID w/ food) for non-hormonal management of abnormal bleeding.

## 2022-04-27 NOTE — ED Provider Triage Note (Signed)
Emergency Medicine Provider Triage Evaluation Note  [name] [name] , a 41 y.o. female  was evaluated in triage.  Pt complains of vaginal bleeding since March, was recently treated with TXA, came off of the TXA 3 days ago and has had much severe vaginal bleeding, describes large bottles of blood.  Patient states she feels weak and fatigued.  No chest pain or shortness of breath.  OB recommended she come into the ER for evaluation.  Patient denies any pain nausea or vomiting or fevers.  Review of Systems  Positive: Vaginal bleeding, fatigue Negative: Abdominal or pelvic pain.  Fevers, nausea, vomiting  Physical Exam  BP (!) 123/96 (BP Location: Left Arm)   Pulse 100   Temp 98.7 F (37.1 C) (Oral)   Resp 18   Ht 5\' 4"  (1.626 m)   Wt (!) 167.8 kg   SpO2 100%   BMI 63.51 kg/m  Gen:   Awake, no distress   Resp:  Normal effort  MSK:   Moves extremities without difficulty  Other:    Medical Decision Making  Medically screening exam initiated at 4:04 PM.  Appropriate orders placed.  [name] was informed that the remainder of the evaluation will be completed by another provider, this initial triage assessment does not replace that evaluation, and the importance of remaining in the ED until their evaluation is complete.     Enrigue Catena, Evon Slack 04/27/22 1605

## 2022-04-27 NOTE — ED Triage Notes (Signed)
Pt states that she has had vaginal bleeding since march 21st. Pt states that her ob placed an iud that was passed in a large blood clot that she passed may 19th, pt finished a course of TXA 3 days ago. Pt states that her last feraheme infusion march 21st.

## 2022-05-12 DIAGNOSIS — M545 Chronic low back pain without sciatica, unspecified back pain laterality: Principal | ICD-10-CM

## 2022-05-12 DIAGNOSIS — G8929 Other chronic pain: Principal | ICD-10-CM

## 2022-05-12 MED ORDER — NAPROXEN 500 MG TABLET
ORAL_TABLET | 0 refills | 0 days | Status: CP
Start: 2022-05-12 — End: ?

## 2022-05-12 MED ORDER — FLUTICASONE PROPIONATE 50 MCG/ACTUATION NASAL SPRAY,SUSPENSION
0 refills | 0 days | Status: CP
Start: 2022-05-12 — End: ?

## 2022-05-12 MED ORDER — METHOCARBAMOL 750 MG TABLET
ORAL_TABLET | 0 refills | 0 days | Status: CP
Start: 2022-05-12 — End: ?

## 2022-05-18 ENCOUNTER — Ambulatory Visit: Admit: 2022-05-18 | Discharge: 2022-05-19 | Payer: PRIVATE HEALTH INSURANCE | Attending: Medical | Primary: Medical

## 2022-05-18 DIAGNOSIS — Z23 Encounter for immunization: Principal | ICD-10-CM

## 2022-05-18 DIAGNOSIS — F418 Other specified anxiety disorders: Principal | ICD-10-CM

## 2022-05-18 DIAGNOSIS — Z8659 Personal history of other mental and behavioral disorders: Principal | ICD-10-CM

## 2022-05-18 DIAGNOSIS — E119 Type 2 diabetes mellitus without complications: Principal | ICD-10-CM

## 2022-05-18 MED ORDER — FLUOXETINE 20 MG CAPSULE
ORAL_CAPSULE | Freq: Every day | ORAL | 2 refills | 30 days | Status: CP
Start: 2022-05-18 — End: 2023-05-18

## 2022-05-18 MED ORDER — BUSPIRONE 15 MG TABLET
ORAL_TABLET | 1 refills | 0 days | Status: CP
Start: 2022-05-18 — End: ?

## 2022-06-01 DIAGNOSIS — R609 Edema, unspecified: Principal | ICD-10-CM

## 2022-06-01 MED ORDER — OZEMPIC 1 MG/DOSE (4 MG/3 ML) SUBCUTANEOUS PEN INJECTOR
SUBCUTANEOUS | 1 refills | 28 days | Status: CP
Start: 2022-06-01 — End: ?

## 2022-06-01 MED ORDER — HYDROCHLOROTHIAZIDE 12.5 MG CAPSULE
ORAL_CAPSULE | 0 refills | 0 days | Status: CP
Start: 2022-06-01 — End: ?

## 2022-07-05 ENCOUNTER — Telehealth
Admit: 2022-07-05 | Discharge: 2022-07-06 | Payer: PRIVATE HEALTH INSURANCE | Attending: Obstetrics & Gynecology | Primary: Obstetrics & Gynecology

## 2022-07-05 DIAGNOSIS — N8500 Endometrial hyperplasia, unspecified: Principal | ICD-10-CM

## 2022-07-05 DIAGNOSIS — N939 Abnormal uterine and vaginal bleeding, unspecified: Principal | ICD-10-CM

## 2022-07-05 DIAGNOSIS — N3946 Mixed incontinence: Principal | ICD-10-CM

## 2022-07-05 MED ORDER — SOLIFENACIN 5 MG TABLET
ORAL_TABLET | Freq: Every day | ORAL | 2 refills | 30.00000 days | Status: CP
Start: 2022-07-05 — End: 2023-07-05

## 2022-07-18 ENCOUNTER — Ambulatory Visit
Admit: 2022-07-18 | Discharge: 2022-07-19 | Payer: PRIVATE HEALTH INSURANCE | Attending: Obstetrics & Gynecology | Primary: Obstetrics & Gynecology

## 2022-07-18 DIAGNOSIS — N939 Abnormal uterine and vaginal bleeding, unspecified: Principal | ICD-10-CM

## 2022-07-22 DIAGNOSIS — E119 Type 2 diabetes mellitus without complications: Principal | ICD-10-CM

## 2022-07-22 MED ORDER — OZEMPIC 1 MG/DOSE (4 MG/3 ML) SUBCUTANEOUS PEN INJECTOR
0 refills | 0 days
Start: 2022-07-22 — End: ?

## 2022-07-24 DIAGNOSIS — N3946 Mixed incontinence: Principal | ICD-10-CM

## 2022-07-24 MED ORDER — OZEMPIC 1 MG/DOSE (4 MG/3 ML) SUBCUTANEOUS PEN INJECTOR
2 refills | 0 days | Status: CP
Start: 2022-07-24 — End: ?

## 2022-09-01 ENCOUNTER — Ambulatory Visit: Admit: 2022-09-01 | Discharge: 2022-09-02 | Payer: PRIVATE HEALTH INSURANCE | Attending: Medical | Primary: Medical

## 2022-09-01 DIAGNOSIS — F418 Other specified anxiety disorders: Principal | ICD-10-CM

## 2022-09-01 DIAGNOSIS — Z6841 Body Mass Index (BMI) 40.0 and over, adult: Principal | ICD-10-CM

## 2022-09-01 MED ORDER — DESVENLAFAXINE SUCCINATE ER 25 MG TABLET,EXTENDED RELEASE 24 HR
ORAL_TABLET | ORAL | 0 refills | 106 days | Status: CP
Start: 2022-09-01 — End: 2022-12-16

## 2022-10-06 ENCOUNTER — Ambulatory Visit
Admit: 2022-10-06 | Discharge: 2022-10-07 | Payer: PRIVATE HEALTH INSURANCE | Attending: Rehabilitative and Restorative Service Providers" | Primary: Rehabilitative and Restorative Service Providers"

## 2022-10-11 MED ORDER — FLUTICASONE PROPIONATE 50 MCG/ACTUATION NASAL SPRAY,SUSPENSION
0 refills | 0 days | Status: CP
Start: 2022-10-11 — End: ?

## 2022-10-16 ENCOUNTER — Telehealth: Admit: 2022-10-16 | Discharge: 2022-10-17 | Payer: PRIVATE HEALTH INSURANCE | Attending: Medical | Primary: Medical

## 2022-10-16 DIAGNOSIS — F418 Other specified anxiety disorders: Principal | ICD-10-CM

## 2022-10-16 DIAGNOSIS — M545 Low back pain without sciatica, unspecified back pain laterality, unspecified chronicity: Principal | ICD-10-CM

## 2022-10-16 DIAGNOSIS — Z6841 Body Mass Index (BMI) 40.0 and over, adult: Principal | ICD-10-CM

## 2022-10-16 MED ORDER — OZEMPIC 2 MG/DOSE (8 MG/3 ML) SUBCUTANEOUS PEN INJECTOR
3 refills | 0 days | Status: CP
Start: 2022-10-16 — End: ?

## 2022-10-16 MED ORDER — DESVENLAFAXINE SUCCINATE ER 25 MG TABLET,EXTENDED RELEASE 24 HR
ORAL_TABLET | 0 refills | 0 days
Start: 2022-10-16 — End: ?

## 2022-10-16 MED ORDER — DESVENLAFAXINE SUCCINATE ER 50 MG TABLET,EXTENDED RELEASE 24 HR
ORAL_TABLET | Freq: Every day | ORAL | 3 refills | 90 days | Status: CP
Start: 2022-10-16 — End: 2023-10-16

## 2022-11-12 DIAGNOSIS — N939 Abnormal uterine and vaginal bleeding, unspecified: Principal | ICD-10-CM

## 2022-11-12 MED ORDER — MEDROXYPROGESTERONE 10 MG TABLET
ORAL_TABLET | Freq: Every day | ORAL | 0 refills | 0.00000 days
Start: 2022-11-12 — End: ?

## 2022-11-15 DIAGNOSIS — N939 Abnormal uterine and vaginal bleeding, unspecified: Principal | ICD-10-CM

## 2022-11-15 MED ORDER — MEDROXYPROGESTERONE 10 MG TABLET
ORAL_TABLET | Freq: Every day | ORAL | 11 refills | 30.00000 days | Status: CP
Start: 2022-11-15 — End: 2023-11-15

## 2022-11-15 MED ORDER — ERGOCALCIFEROL (VITAMIN D2) 1,250 MCG (50,000 UNIT) CAPSULE
ORAL_CAPSULE | ORAL | 0 refills | 0 days
Start: 2022-11-15 — End: ?

## 2022-11-16 MED ORDER — ERGOCALCIFEROL (VITAMIN D2) 1,250 MCG (50,000 UNIT) CAPSULE
ORAL_CAPSULE | ORAL | 3 refills | 84 days | Status: CP
Start: 2022-11-16 — End: ?

## 2022-12-07 ENCOUNTER — Ambulatory Visit: Admit: 2022-12-07 | Payer: PRIVATE HEALTH INSURANCE

## 2022-12-08 ENCOUNTER — Ambulatory Visit: Admit: 2022-12-08 | Payer: PRIVATE HEALTH INSURANCE

## 2023-01-08 ENCOUNTER — Ambulatory Visit
Admit: 2023-01-08 | Discharge: 2023-01-09 | Payer: PRIVATE HEALTH INSURANCE | Attending: Rehabilitative and Restorative Service Providers" | Primary: Rehabilitative and Restorative Service Providers"

## 2023-01-09 ENCOUNTER — Ambulatory Visit: Admit: 2023-01-09 | Discharge: 2023-01-10 | Payer: PRIVATE HEALTH INSURANCE

## 2023-01-09 DIAGNOSIS — M47816 Spondylosis without myelopathy or radiculopathy, lumbar region: Principal | ICD-10-CM

## 2023-01-09 DIAGNOSIS — G8929 Other chronic pain: Principal | ICD-10-CM

## 2023-01-09 DIAGNOSIS — M545 Chronic bilateral low back pain without sciatica: Principal | ICD-10-CM

## 2023-01-12 ENCOUNTER — Ambulatory Visit: Admit: 2023-01-12 | Discharge: 2023-01-13 | Payer: PRIVATE HEALTH INSURANCE

## 2023-01-12 ENCOUNTER — Ambulatory Visit
Admit: 2023-01-12 | Discharge: 2023-01-13 | Payer: PRIVATE HEALTH INSURANCE | Attending: Physician Assistant | Primary: Physician Assistant

## 2023-01-12 DIAGNOSIS — Z6841 Body Mass Index (BMI) 40.0 and over, adult: Principal | ICD-10-CM

## 2023-01-17 ENCOUNTER — Ambulatory Visit: Admit: 2023-01-17 | Discharge: 2023-01-18 | Payer: PRIVATE HEALTH INSURANCE

## 2023-01-23 ENCOUNTER — Ambulatory Visit: Admit: 2023-01-23 | Discharge: 2023-01-24 | Payer: PRIVATE HEALTH INSURANCE

## 2023-01-23 DIAGNOSIS — R110 Nausea: Principal | ICD-10-CM

## 2023-01-23 MED ORDER — ONDANSETRON 8 MG DISINTEGRATING TABLET
ORAL_TABLET | Freq: Three times a day (TID) | 3 refills | 10 days | Status: CP | PRN
Start: 2023-01-23 — End: 2023-01-30

## 2023-02-02 ENCOUNTER — Ambulatory Visit
Admit: 2023-02-02 | Discharge: 2023-02-03 | Payer: PRIVATE HEALTH INSURANCE | Attending: Physical Medicine & Rehabilitation | Primary: Physical Medicine & Rehabilitation

## 2023-02-02 DIAGNOSIS — M47816 Spondylosis without myelopathy or radiculopathy, lumbar region: Principal | ICD-10-CM

## 2023-02-09 ENCOUNTER — Ambulatory Visit: Admit: 2023-02-09 | Discharge: 2023-02-10 | Payer: PRIVATE HEALTH INSURANCE

## 2023-02-09 DIAGNOSIS — N3946 Mixed incontinence: Principal | ICD-10-CM

## 2023-02-09 DIAGNOSIS — G4733 Obstructive sleep apnea (adult) (pediatric): Principal | ICD-10-CM

## 2023-02-09 MED ORDER — SOLIFENACIN 10 MG TABLET
ORAL_TABLET | Freq: Every day | ORAL | 11 refills | 30 days | Status: CP
Start: 2023-02-09 — End: 2024-02-09

## 2023-02-27 ENCOUNTER — Ambulatory Visit: Admit: 2023-02-27 | Payer: PRIVATE HEALTH INSURANCE

## 2023-03-14 ENCOUNTER — Ambulatory Visit
Admit: 2023-03-14 | Discharge: 2023-03-15 | Payer: PRIVATE HEALTH INSURANCE | Attending: Physician Assistant | Primary: Physician Assistant

## 2023-03-14 DIAGNOSIS — Z6841 Body Mass Index (BMI) 40.0 and over, adult: Principal | ICD-10-CM

## 2023-03-14 DIAGNOSIS — G4733 Obstructive sleep apnea (adult) (pediatric): Principal | ICD-10-CM

## 2023-03-21 ENCOUNTER — Ambulatory Visit: Admit: 2023-03-21 | Payer: PRIVATE HEALTH INSURANCE

## 2023-03-28 DIAGNOSIS — G4733 Obstructive sleep apnea (adult) (pediatric): Principal | ICD-10-CM

## 2023-04-10 ENCOUNTER — Ambulatory Visit: Admit: 2023-04-10 | Payer: PRIVATE HEALTH INSURANCE

## 2023-04-17 ENCOUNTER — Ambulatory Visit: Admit: 2023-04-17 | Discharge: 2023-04-18 | Payer: PRIVATE HEALTH INSURANCE

## 2023-04-17 DIAGNOSIS — Z6841 Body Mass Index (BMI) 40.0 and over, adult: Principal | ICD-10-CM

## 2023-04-17 DIAGNOSIS — R350 Frequency of micturition: Principal | ICD-10-CM

## 2023-04-17 DIAGNOSIS — B354 Tinea corporis: Principal | ICD-10-CM

## 2023-04-17 DIAGNOSIS — N611 Abscess of the breast and nipple: Principal | ICD-10-CM

## 2023-04-17 DIAGNOSIS — L732 Hidradenitis suppurativa: Principal | ICD-10-CM

## 2023-04-17 MED ORDER — SULFAMETHOXAZOLE 800 MG-TRIMETHOPRIM 160 MG TABLET
ORAL_TABLET | Freq: Two times a day (BID) | ORAL | 0 refills | 10 days | Status: CP
Start: 2023-04-17 — End: 2023-04-27

## 2023-04-17 MED ORDER — NITROFURANTOIN MONOHYDRATE/MACROCRYSTALS 100 MG CAPSULE
ORAL_CAPSULE | Freq: Two times a day (BID) | ORAL | 0 refills | 5 days | Status: CP
Start: 2023-04-17 — End: 2023-04-17

## 2023-04-17 MED ORDER — NYSTATIN 100,000 UNIT/GRAM TOPICAL POWDER
2 refills | 0 days | Status: CP
Start: 2023-04-17 — End: 2024-04-16

## 2023-05-01 ENCOUNTER — Ambulatory Visit: Admit: 2023-05-01 | Discharge: 2023-05-02 | Payer: PRIVATE HEALTH INSURANCE

## 2023-05-01 DIAGNOSIS — J3089 Other allergic rhinitis: Principal | ICD-10-CM

## 2023-05-01 DIAGNOSIS — L732 Hidradenitis suppurativa: Principal | ICD-10-CM

## 2023-05-01 DIAGNOSIS — L700 Acne vulgaris: Principal | ICD-10-CM

## 2023-05-01 MED ORDER — LORATADINE 10 MG TABLET
ORAL_TABLET | Freq: Every day | ORAL | 2 refills | 30 days | Status: CP
Start: 2023-05-01 — End: 2024-04-30

## 2023-05-01 MED ORDER — FLUTICASONE PROPIONATE 50 MCG/ACTUATION NASAL SPRAY,SUSPENSION
Freq: Two times a day (BID) | NASAL | 11 refills | 30 days | Status: CP | PRN
Start: 2023-05-01 — End: 2023-05-31

## 2023-05-01 MED ORDER — CLINDAMYCIN PHOSPHATE 1 % TOPICAL SOLUTION
11 refills | 0 days | Status: CP
Start: 2023-05-01 — End: 2024-04-30

## 2023-05-01 MED ORDER — DOXYCYCLINE HYCLATE 100 MG CAPSULE
ORAL_CAPSULE | Freq: Two times a day (BID) | ORAL | 3 refills | 30 days | Status: CP
Start: 2023-05-01 — End: 2023-08-29

## 2023-05-14 ENCOUNTER — Ambulatory Visit
Admit: 2023-05-14 | Discharge: 2023-05-15 | Payer: PRIVATE HEALTH INSURANCE | Attending: Rehabilitative and Restorative Service Providers" | Primary: Rehabilitative and Restorative Service Providers"

## 2023-05-15 ENCOUNTER — Ambulatory Visit: Admit: 2023-05-15 | Payer: PRIVATE HEALTH INSURANCE

## 2023-05-22 ENCOUNTER — Ambulatory Visit
Admit: 2023-05-22 | Discharge: 2023-05-23 | Payer: PRIVATE HEALTH INSURANCE | Attending: Physician Assistant | Primary: Physician Assistant

## 2023-05-22 ENCOUNTER — Ambulatory Visit
Admit: 2023-05-22 | Discharge: 2023-05-23 | Payer: PRIVATE HEALTH INSURANCE | Attending: Obstetrics & Gynecology | Primary: Obstetrics & Gynecology

## 2023-05-22 DIAGNOSIS — N939 Abnormal uterine and vaginal bleeding, unspecified: Principal | ICD-10-CM

## 2023-05-22 DIAGNOSIS — N8500 Endometrial hyperplasia, unspecified: Principal | ICD-10-CM

## 2023-05-30 DIAGNOSIS — N939 Abnormal uterine and vaginal bleeding, unspecified: Principal | ICD-10-CM

## 2023-06-11 ENCOUNTER — Ambulatory Visit: Admit: 2023-06-11 | Payer: PRIVATE HEALTH INSURANCE

## 2023-06-19 ENCOUNTER — Ambulatory Visit: Admit: 2023-06-19 | Discharge: 2023-06-20 | Payer: PRIVATE HEALTH INSURANCE

## 2023-06-21 MED ORDER — NAPROXEN 500 MG TABLET
ORAL_TABLET | Freq: Two times a day (BID) | ORAL | 0 refills | 20 days | Status: CP
Start: 2023-06-21 — End: 2023-07-11

## 2023-06-23 ENCOUNTER — Ambulatory Visit: Admit: 2023-06-23 | Discharge: 2023-06-25 | Payer: PRIVATE HEALTH INSURANCE

## 2023-07-09 ENCOUNTER — Ambulatory Visit: Admit: 2023-07-09 | Discharge: 2023-07-10 | Payer: PRIVATE HEALTH INSURANCE

## 2023-07-09 DIAGNOSIS — Z01818 Encounter for other preprocedural examination: Principal | ICD-10-CM

## 2023-07-09 DIAGNOSIS — E119 Type 2 diabetes mellitus without complications: Principal | ICD-10-CM

## 2023-07-09 DIAGNOSIS — J309 Allergic rhinitis, unspecified: Principal | ICD-10-CM

## 2023-07-09 DIAGNOSIS — G4733 Obstructive sleep apnea (adult) (pediatric): Principal | ICD-10-CM

## 2023-07-09 DIAGNOSIS — N939 Abnormal uterine and vaginal bleeding, unspecified: Principal | ICD-10-CM

## 2023-07-09 DIAGNOSIS — M5440 Lumbago with sciatica, unspecified side: Principal | ICD-10-CM

## 2023-07-09 DIAGNOSIS — F418 Other specified anxiety disorders: Principal | ICD-10-CM

## 2023-07-12 ENCOUNTER — Ambulatory Visit: Admit: 2023-07-12 | Discharge: 2023-07-12 | Payer: PRIVATE HEALTH INSURANCE

## 2023-07-12 ENCOUNTER — Encounter
Admit: 2023-07-12 | Discharge: 2023-07-12 | Payer: PRIVATE HEALTH INSURANCE | Attending: Anesthesiology | Primary: Anesthesiology

## 2023-07-12 MED ORDER — POLYETHYLENE GLYCOL 3350 17 GRAM/DOSE ORAL POWDER
Freq: Every day | ORAL | 0 refills | 30 days | Status: CP
Start: 2023-07-12 — End: 2023-08-11
  Filled 2023-07-12: qty 510, 30d supply, fill #0

## 2023-07-12 MED ORDER — IBUPROFEN 800 MG TABLET
ORAL | 0 refills | 10 days | Status: CP | PRN
Start: 2023-07-12 — End: ?
  Filled 2023-07-12: qty 30, 10d supply, fill #0

## 2023-07-12 MED ORDER — OXYCODONE 5 MG TABLET
ORAL_TABLET | ORAL | 0 refills | 2 days | Status: CP | PRN
Start: 2023-07-12 — End: 2023-07-17
  Filled 2023-07-12: qty 10, 2d supply, fill #0

## 2023-07-12 MED ORDER — ACETAMINOPHEN 500 MG TABLET
ORAL_TABLET | Freq: Four times a day (QID) | ORAL | 0 refills | 4 days | Status: CP | PRN
Start: 2023-07-12 — End: 2024-07-11
  Filled 2023-07-12: qty 30, 4d supply, fill #0

## 2023-07-14 DIAGNOSIS — N939 Abnormal uterine and vaginal bleeding, unspecified: Principal | ICD-10-CM

## 2023-07-19 ENCOUNTER — Ambulatory Visit: Admit: 2023-07-19 | Discharge: 2023-07-20 | Payer: PRIVATE HEALTH INSURANCE

## 2023-07-19 DIAGNOSIS — M2550 Pain in unspecified joint: Principal | ICD-10-CM

## 2023-07-19 DIAGNOSIS — M791 Myalgia, unspecified site: Principal | ICD-10-CM

## 2023-08-20 ENCOUNTER — Ambulatory Visit
Admit: 2023-08-20 | Discharge: 2023-08-21 | Payer: PRIVATE HEALTH INSURANCE | Attending: Obstetrics & Gynecology | Primary: Obstetrics & Gynecology

## 2023-08-20 DIAGNOSIS — Z09 Encounter for follow-up examination after completed treatment for conditions other than malignant neoplasm: Principal | ICD-10-CM

## 2023-08-29 ENCOUNTER — Ambulatory Visit
Admit: 2023-08-29 | Discharge: 2023-08-30 | Attending: Rehabilitative and Restorative Service Providers" | Primary: Rehabilitative and Restorative Service Providers"

## 2023-12-13 ENCOUNTER — Ambulatory Visit
Admit: 2023-12-13 | Discharge: 2023-12-14 | Payer: PRIVATE HEALTH INSURANCE | Attending: Physician Assistant | Primary: Physician Assistant

## 2023-12-13 DIAGNOSIS — G4700 Insomnia, unspecified: Principal | ICD-10-CM

## 2023-12-13 DIAGNOSIS — G4733 Obstructive sleep apnea (adult) (pediatric): Principal | ICD-10-CM

## 2023-12-13 DIAGNOSIS — Z599 Problem related to housing and economic circumstances, unspecified: Principal | ICD-10-CM

## 2023-12-13 DIAGNOSIS — G478 Other sleep disorders: Principal | ICD-10-CM

## 2023-12-13 DIAGNOSIS — F32A Depression, unspecified depression type: Principal | ICD-10-CM

## 2024-01-28 ENCOUNTER — Ambulatory Visit: Admit: 2024-01-28 | Discharge: 2024-01-29

## 2024-01-29 ENCOUNTER — Ambulatory Visit: Admit: 2024-01-29 | Discharge: 2024-01-30 | Payer: PRIVATE HEALTH INSURANCE

## 2024-01-29 DIAGNOSIS — M791 Myalgia, unspecified site: Principal | ICD-10-CM

## 2024-01-29 DIAGNOSIS — M2550 Pain in unspecified joint: Principal | ICD-10-CM

## 2024-02-04 ENCOUNTER — Ambulatory Visit: Admit: 2024-02-04 | Discharge: 2024-02-05 | Payer: PRIVATE HEALTH INSURANCE

## 2024-02-11 ENCOUNTER — Ambulatory Visit: Admit: 2024-02-11 | Discharge: 2024-02-12 | Payer: PRIVATE HEALTH INSURANCE

## 2024-02-18 ENCOUNTER — Ambulatory Visit: Admit: 2024-02-18 | Discharge: 2024-02-19 | Payer: PRIVATE HEALTH INSURANCE

## 2024-03-03 ENCOUNTER — Ambulatory Visit: Admit: 2024-03-03 | Discharge: 2024-03-04

## 2024-03-17 ENCOUNTER — Ambulatory Visit: Admit: 2024-03-17 | Payer: Medicaid (Managed Care) | Attending: Nutritionist | Primary: Nutritionist

## 2024-03-17 ENCOUNTER — Ambulatory Visit: Admit: 2024-03-17 | Payer: Medicaid (Managed Care)

## 2024-03-24 ENCOUNTER — Ambulatory Visit: Admit: 2024-03-24 | Payer: Medicaid (Managed Care)

## 2024-03-25 ENCOUNTER — Ambulatory Visit: Admit: 2024-03-25 | Discharge: 2024-03-26 | Payer: Medicaid (Managed Care)

## 2024-04-14 ENCOUNTER — Ambulatory Visit: Admit: 2024-04-14 | Payer: Medicaid (Managed Care)

## 2024-05-06 DIAGNOSIS — M545 Chronic low back pain without sciatica, unspecified back pain laterality: Principal | ICD-10-CM

## 2024-05-06 DIAGNOSIS — G8929 Other chronic pain: Principal | ICD-10-CM

## 2024-05-06 DIAGNOSIS — Z6841 Body Mass Index (BMI) 40.0 and over, adult: Principal | ICD-10-CM

## 2024-05-06 DIAGNOSIS — E66813 Class 3 severe obesity due to excess calories with body mass index (BMI) of 50.0 to 59.9 in adult, unspecified whether serious comorbidity present (CMS-HCC): Principal | ICD-10-CM

## 2024-05-06 DIAGNOSIS — J3089 Other allergic rhinitis: Principal | ICD-10-CM

## 2024-05-06 DIAGNOSIS — R0602 Shortness of breath: Principal | ICD-10-CM

## 2024-05-06 MED ORDER — ALBUTEROL SULFATE HFA 90 MCG/ACTUATION INHALER T-HOME
RESPIRATORY_TRACT | 2 refills | 0.00000 days | PRN
Start: 2024-05-06 — End: 2025-05-06

## 2024-05-06 MED ORDER — OZEMPIC 2 MG/DOSE (8 MG/3 ML) SUBCUTANEOUS PEN INJECTOR
3 refills | 0.00000 days
Start: 2024-05-06 — End: ?

## 2024-05-06 MED ORDER — ERGOCALCIFEROL (VITAMIN D2) 1,250 MCG (50,000 UNIT) CAPSULE
ORAL_CAPSULE | ORAL | 3 refills | 84.00000 days
Start: 2024-05-06 — End: ?

## 2024-05-06 MED ORDER — SOLIFENACIN 10 MG TABLET
ORAL_TABLET | Freq: Every day | ORAL | 11 refills | 30.00000 days
Start: 2024-05-06 — End: 2025-05-06

## 2024-05-06 MED ORDER — METHOCARBAMOL 750 MG TABLET
ORAL_TABLET | Freq: Three times a day (TID) | ORAL | 0 refills | 20.00000 days | Status: CP | PRN
Start: 2024-05-06 — End: ?
  Filled 2024-07-02: qty 60, 20d supply, fill #0

## 2024-05-06 MED ORDER — FLUTICASONE PROPIONATE 50 MCG/ACTUATION NASAL SPRAY,SUSPENSION
Freq: Two times a day (BID) | NASAL | 0 refills | 90.00000 days | Status: CP | PRN
Start: 2024-05-06 — End: 2024-06-05

## 2024-05-06 MED ORDER — FERROUS FUMARATE 324 MG (106 MG IRON) TABLET
ORAL_TABLET | Freq: Every day | ORAL | 1 refills | 90.00000 days
Start: 2024-05-06 — End: ?

## 2024-05-07 MED ORDER — OZEMPIC 2 MG/DOSE (8 MG/3 ML) SUBCUTANEOUS PEN INJECTOR
SUBCUTANEOUS | 3 refills | 0.00000 days | Status: CP
Start: 2024-05-07 — End: ?

## 2024-05-07 MED ORDER — ALBUTEROL SULFATE HFA 90 MCG/ACTUATION INHALER T-HOME
RESPIRATORY_TRACT | 2 refills | 0.00000 days | Status: CP | PRN
Start: 2024-05-07 — End: 2025-05-07

## 2024-05-07 MED ORDER — ERGOCALCIFEROL (VITAMIN D2) 1,250 MCG (50,000 UNIT) CAPSULE
ORAL_CAPSULE | ORAL | 3 refills | 84.00000 days | Status: CP
Start: 2024-05-07 — End: ?

## 2024-05-07 MED ORDER — FERROUS FUMARATE 324 MG (106 MG IRON) TABLET
ORAL_TABLET | Freq: Every day | ORAL | 1 refills | 90.00000 days | Status: CP
Start: 2024-05-07 — End: ?

## 2024-05-09 DIAGNOSIS — E66813 Class 3 severe obesity due to excess calories with body mass index (BMI) of 50.0 to 59.9 in adult, unspecified whether serious comorbidity present (CMS-HCC): Principal | ICD-10-CM

## 2024-05-09 DIAGNOSIS — Z6841 Body Mass Index (BMI) 40.0 and over, adult: Principal | ICD-10-CM

## 2024-06-19 ENCOUNTER — Encounter
Admit: 2024-06-19 | Discharge: 2024-06-20 | Payer: Medicaid (Managed Care) | Attending: Student in an Organized Health Care Education/Training Program | Primary: Student in an Organized Health Care Education/Training Program

## 2024-06-19 ENCOUNTER — Ambulatory Visit: Admit: 2024-06-19 | Discharge: 2024-06-20 | Payer: Medicaid (Managed Care)

## 2024-06-19 DIAGNOSIS — Z79899 Other long term (current) drug therapy: Principal | ICD-10-CM

## 2024-06-19 DIAGNOSIS — L732 Hidradenitis suppurativa: Principal | ICD-10-CM

## 2024-06-19 DIAGNOSIS — L219 Seborrheic dermatitis, unspecified: Principal | ICD-10-CM

## 2024-06-19 DIAGNOSIS — L304 Erythema intertrigo: Principal | ICD-10-CM

## 2024-06-19 MED ORDER — HYDROCORTISONE 2.5 % TOPICAL OINTMENT
Freq: Two times a day (BID) | TOPICAL | 1 refills | 0.00000 days | Status: CP
Start: 2024-06-19 — End: 2025-06-19
  Filled 2024-06-24: qty 28.35, 14d supply, fill #0

## 2024-06-19 MED ORDER — KETOCONAZOLE 2 % TOPICAL CREAM
Freq: Two times a day (BID) | TOPICAL | 5 refills | 60.00000 days | Status: CP
Start: 2024-06-19 — End: 2025-06-19

## 2024-06-19 MED ORDER — KETOCONAZOLE 2 % SHAMPOO
TOPICAL | 11 refills | 0.00000 days | Status: CP
Start: 2024-06-19 — End: 2024-06-19
  Filled 2024-06-24: qty 60, 30d supply, fill #0

## 2024-06-19 MED ORDER — NYSTATIN 100,000 UNIT/GRAM TOPICAL POWDER
TOPICAL | 6 refills | 0.00000 days | Status: CP
Start: 2024-06-19 — End: ?
  Filled 2024-06-24: qty 60, 30d supply, fill #0

## 2024-06-19 MED ORDER — COSENTYX UNOREADY PEN 300 MG/2 ML SUBCUTANEOUS PEN INJECTOR
SUBCUTANEOUS | 0 refills | 0.00000 days | Status: CP
Start: 2024-06-19 — End: ?
  Filled 2024-06-27: qty 10, 56d supply, fill #0

## 2024-06-20 DIAGNOSIS — L732 Hidradenitis suppurativa: Principal | ICD-10-CM

## 2024-06-25 NOTE — Unmapped (Signed)
 [facility] Specialty Medication Onboarding    Specialty Medication: COSENTYX  UNOREADY PEN 300 mg/2 mL Pnij (secukinumab )  Prior Authorization: Approved   Financial Assistance: No - copay  <$25  Final Copay/Day Supply: ($4 / 56 LD) ($4 / 28 MD)    Insurance Restrictions: None     Notes to Pharmacist:   Credit Card on File: yes  Start Date on Rx:  06/19/24    The triage team has completed the benefits investigation and has determined that the patient is able to fill this medication at [facility]. Please contact the patient to complete the onboarding or follow up with the prescribing physician as needed.

## 2024-06-26 MED ORDER — EMPTY CONTAINER
2 refills | 0.00000 days
Start: 2024-06-26 — End: ?

## 2024-06-26 NOTE — Unmapped (Signed)
 [facility]    Patient Onboarding/Medication Counseling    Kelly May is a 43 y.o. female with hidradenitis suppurtiva who I am counseling today on initiation of therapy.  I am speaking to the patient.    Was a Nurse, learning disability used for this call? No    Verified patient's date of birth / HIPAA.    Specialty medication(s) to be sent: Inflammatory Disorders: Cosentyx       Non-specialty medications/supplies to be sent: sharps kit      Medications not needed at this time: na         Cosentyx  (secukinumab )    Medication & Administration     Dosage: Hidradenitis Suppurativa: Inject 300mg  under the skin at weeks 0, 1, 2, 3, and 4 followed by 300 mg every 4 weeks; consider an increase to 300 mg every 2 weeks in patients who have an inadequate response      Lab tests required prior to treatment initiation:  Tuberculosis: Tuberculosis screening resulted in a non-reactive Quantiferon TB Gold assay.      Administration:     Prefilled Unoready?? auto-injector pen  Gather all supplies needed for injection on a clean, flat working surface: medication pen removed from packaging, alcohol swab, sharps container, etc.  Look at the medication label - look for correct medication, correct dose, and check the expiration date  Look at the medication - the liquid visible in the window on the side of the pen device should appear clear and colorless to slightly yellow  Lay the auto-injector pen on a flat surface and allow it to warm up to room temperature for at least  30-45 minutes  Select injection site - you can use the front of your thigh or your belly (but not the area 2 inches around your belly button); if someone else is giving you the injection you can also use your upper arm in the skin covering your triceps muscle  Prepare injection site - wash your hands and clean the skin at the injection site with an alcohol swab and let it air dry, do not touch the injection site again before the injection  Pull off the purple safety cap in the direction of the arrow, do not remove until immediately prior to injection and do not touch the red needle cover  Put the red needle cover against your skin at the injection site at a 90 degree angle, hold the pen such that you can see the clear medication window  Press down and hold the pen firmly against your skin, there will be a click when the injection starts  Continue to hold the pen firmly against your skin for about 10-15 seconds - the window will start to turn solid green  There will be a second click sound when the injection is almost complete, verify the window is solid green to indicate the injection is complete and then pull the pen away from your skin  Dispose of the used auto-injector pen immediately in your sharps disposal container the needle will be covered automatically  If you see any blood at the injection site, press a cotton ball or gauze on the site and maintain pressure until the bleeding stops, do not rub the injection site      Adherence/Missed dose instructions:  If your injection is given more than 4 days after your scheduled injection date - consult your pharmacist for additional instructions on how to adjust your dosing schedule.  Goals of Therapy       Hidradenitis Suppurativa  Reduce the frequency and severity of new lesions  Minimize pain and suppuration  Prevent disease progression and limit scarring  Maintenance of effective psychosocial functioning        Side Effects & Monitoring Parameters     Injection site reaction (redness, irritation, inflammation localized to the site of administration)  Signs of a common cold - minor sore throat, runny or stuffy nose, etc.  Diarrhea    The following side effects should be reported to the provider:  Signs of a hypersensitivity reaction - rash; hives; itching; red, swollen, blistered, or peeling skin; wheezing; tightness in the chest or throat; difficulty breathing, swallowing, or talking; swelling of the mouth, face, lips, tongue, or throat; etc.  Reduced immune function - report signs of infection such as fever; chills; body aches; very bad sore throat; ear or sinus pain; cough; more sputum or change in color of sputum; pain with passing urine; wound that will not heal, etc.  Also at a slightly higher risk of some malignancies (mainly skin and blood cancers) due to this reduced immune function.  In the case of signs of infection - the patient should hold the next dose of Cosentyx ?? and call your primary care provider to ensure adequate medical care.  Treatment may be resumed when infection is treated and patient is asymptomatic.  Muscle pain or weakness  Shortness of breath      Warnings, Precautions, & Contraindications     Have your bloodwork checked as you have been told by your prescriber  Talk with your doctor if you are pregnant, planning to become pregnant, or breastfeeding  Discuss the possible need for holding your dose(s) of Cosentyx ?? when a planned procedure is scheduled with the prescriber as it may delay healing/recovery timeline       Drug/Food Interactions     Medication list reviewed in Epic. The patient was instructed to inform the care team before taking any new medications or supplements. No drug interactions identified.   If you have a latex allergy use caution when handling, the needle cap of the Cosentyx ?? prefilled syringe and the safety cap for the Cosentyx  Sensoready?? pen contains a derivative of natural rubber latex. Unoready?? pen does NOT contain latex.   Talk with you prescriber or pharmacist before receiving any live vaccinations while taking this medication and after you stop taking it      Storage, Handling Precautions, & Disposal     Store this medication in the refrigerator.  Do not freeze  May store intact Sensoready pens and 150 mg/mL prefilled syringes at <=30??C (<=86??F) for up to 4 days; may return to the refrigerator if unused  Store in original packaging, protected from light  Do not shake  Dispose of used syringes/pens in a sharps disposal container           Current Medications (including OTC/herbals), Comorbidities and Allergies     Current Medications[1]    Allergies[2]    Problem List[3]    Medication list has been reviewed and updated in Epic: Yes    Allergies have been reviewed and updated in Epic: Yes    Appropriateness of Therapy     Acute infections noted within Epic:  No active infections  Patient reported infection: None    Is the medication and dose appropriate based on diagnosis, medication list, comorbidities, allergies, medical history, patient???s ability to self-administer the medication, and therapeutic goals? Yes  Prescription has been clinically reviewed: Yes      Baseline Quality of Life Assessment      How many days over the past month did your HS  keep you from your normal activities? For example, brushing your teeth or getting up in the morning. Patient declined to answer    Financial Information     Medication Assistance provided: Prior Authorization    Anticipated copay of $4 reviewed with patient. Verified delivery address.    Delivery Information     Scheduled delivery date: 8/1    Expected start date: 8/1      Medication will be delivered via Same Day Courier to the prescription address in [facility].  This shipment will not require a signature.      Explained the services we provide at [facility] and that each month we would call to set up refills.  Stressed importance of returning phone calls so that we could ensure they receive their medications in time each month.  Informed patient that we should be setting up refills 7-10 days prior to when they will run out of medication.  A pharmacist will reach out to perform a clinical assessment periodically.  Informed patient that a welcome packet, containing information about our pharmacy and other support services, a Notice of Privacy Practices, and a drug information handout will be sent.      The patient or caregiver noted above participated in the development of this care plan and knows that they can request review of or adjustments to the care plan at any time.      Patient or caregiver verbalized understanding of the above information as well as how to contact the pharmacy at [phone] option 4 with any questions/concerns.  The pharmacy is open Monday through Friday 8:30am-4:30pm.  A pharmacist is available 24/7 via pager to answer any clinical questions they may have.    Patient Specific Needs     Does the patient have any physical, cognitive, or cultural barriers? No    Does the patient have adequate living arrangements? (i.e. the ability to store and take their medication appropriately) Yes    Did you identify any home environmental safety or security hazards? No    Patient prefers to have medications discussed with  Patient     Is the patient or caregiver able to read and understand education materials at a high school level or above? Yes    Patient's primary language is  English     Is the patient high risk? No    Does the patient have an additional or emergency contact listed in their chart? Yes    SOCIAL DETERMINANTS OF HEALTH     At the [facility], we have learned that life circumstances - like trouble affording food, housing, utilities, or transportation can affect the health of many of our patients.   That is why we wanted to ask: are you currently experiencing any life circumstances that are negatively impacting your health and/or quality of life? Patient declined to answer    Social Drivers of Health     Food Insecurity: Food Insecurity Present (01/23/2024)    Hunger Vital Sign     Worried About Running Out of Food in the Last Year: Sometimes true     Ran Out of Food in the Last Year: Sometimes true   Tobacco Use: Medium Risk (06/19/2024)    Patient History     Smoking Tobacco Use: Former  Smokeless Tobacco Use: Former     Passive Exposure: Not on file Transportation Needs: No Transportation Needs (01/23/2024)    PRAPARE - Therapist, art (Medical): No     Lack of Transportation (Non-Medical): No   Alcohol Use: Not on file   Housing: Unknown (06/25/2022)    Housing     Within the past 12 months, have you ever stayed: outside, in a car, in a tent, in an overnight shelter, or temporarily in someone else's home (i.e. couch-surfing)?: No     Are you worried about losing your housing?: Not on file   Physical Activity: Inactive (01/23/2024)    Exercise Vital Sign     Days of Exercise per Week: 0 days     Minutes of Exercise per Session: 0 min   Utilities: Not on file   Stress: Stress Concern Present (01/23/2024)    [employer] of Occupational Health - Occupational Stress Questionnaire     Feeling of Stress : Rather much   Interpersonal Safety: Not on file   Substance Use: Not on file (10/07/2023)   Intimate Partner Violence: Not At Risk (01/23/2024)    Humiliation, Afraid, Rape, and Kick questionnaire     Fear of Current or Ex-Partner: No     Emotionally Abused: No     Physically Abused: No     Sexually Abused: No   Social Connections: Socially Isolated (01/23/2024)    Social Connection and Isolation Panel     Frequency of Communication with Friends and Family: Twice a week     Frequency of Social Gatherings with Friends and Family: Once a week     Attends Religious Services: Never     Database administrator or Organizations: No     Attends Engineer, structural: Not on file     Marital Status: Widowed   Physicist, medical Strain: High Risk (01/23/2024)    Overall Financial Resource Strain (CARDIA)     Difficulty of Paying Living Expenses: Very hard   Health Literacy: Low Risk  (06/18/2022)    Health Literacy     : Never   Internet Connectivity: Not on file       Would you be willing to receive help with any of the needs that you have identified today? Not applicable       Ersel Wadleigh [facility] Specialty Pharmacist         [1]   Current Outpatient Medications   Medication Sig Dispense Refill    albuterol  (PROVENTIL  HFA;VENTOLIN  HFA) 90 mcg/actuation inhaler Inhale 2 puffs every four (4) hours as needed for wheezing or shortness of breath. 8 each 2    desvenlafaxine  (PRISTIQ ) 50 MG 24 hr tablet Take 1 tablet (50 mg total) by mouth daily. 90 tablet 3    empty container (SHARPS CONTAINER) Misc Use as directed (Patient not taking: Reported on 01/29/2024) 1 each 2    empty container Misc Use as directed to dispose of Cosentyx  pens. 1 each 2    ergocalciferol -1,250 mcg, 50,000 unit, (DRISDOL ) 1,250 mcg (50,000 unit) capsule Take 1 capsule (1,250 mcg total) by mouth once a week. 12 capsule 3    ferrous fumarate  324 mg (106 mg iron ) Tab Take 1 tablet (324 mg total) by mouth daily. 90 tablet 1    fluticasone  propionate (FLONASE ) 50 mcg/actuation nasal spray 2 sprays into each nostril two (2) times a day as needed for rhinitis. 48 g  0    hydrocortisone  2.5 % ointment Apply 1 Application topically two (2) times a day. Apply to affected area twice daily until improved or for up to 2 weeks, whichever is sooner. Break for 1-2 weeks. Restart as needed. 28.35 g 1    ibuprofen  (MOTRIN ) 800 MG tablet Take 1 tablet (800 mg total) by mouth every eight (8) hours as needed for pain (for moderate pain). (Patient not taking: Reported on 01/29/2024) 30 tablet 0    ketoconazole  (NIZORAL ) 2 % cream Apply 1 Application topically two (2) times a day. 60 g 5    ketoconazole  (NIZORAL ) 2 % shampoo Apply to scalp 2-3x weekly. Lather, leave in for 5 minutes, then rinse. Can alternate with over-the-counter anti-dandruff shampoo such as Head & Shoulders. 120 mL 11    methocarbamol  (ROBAXIN ) 750 MG tablet Take 1 tablet (750 mg total) by mouth Three (3) times a day as needed. 60 tablet 0    mirtazapine (REMERON) 15 MG tablet Take 1 tablet (15 mg total) by mouth nightly. (Patient not taking: Reported on 07/19/2023)      norethindrone  (AYGESTIN ) 5 mg tablet Take 1 tablet (5 mg total) by mouth daily. (Patient not taking: Reported on 07/19/2023)      nystatin  (MYCOSTATIN ) 100,000 unit/gram powder Apply to affected area 2-4 times a day 60 g 6    secukinumab  (COSENTYX  UNOREADY PEN) 300 mg/2 mL PnIj Inject the contents of 1 pen (300 mg) under the skin every twenty-eight (28) days. 2 mL 11    secukinumab  (COSENTYX  UNOREADY PEN) 300 mg/2 mL PnIj Inject the contents of 1 pen (300 mg) under the skin at weeks 0, 1, 2, 3, and 4 followed by 300 mg every 4 weeks 10 mL 0    semaglutide  (OZEMPIC ) 2 mg/dose (8 mg/3 mL) PnIj Inject 2 mg subcutaneous once weekly 9 mL 3    solifenacin  (VESICARE ) 10 MG tablet Take 1 tablet (10 mg total) by mouth daily. (Patient not taking: Reported on 07/19/2023) 30 tablet 11     No current facility-administered medications for this visit.   [2]   Allergies  Allergen Reactions    Buspirone      Prozac  [Fluoxetine ]    [3]   Patient Active Problem List  Diagnosis    OSA (obstructive sleep apnea)    Depression    Class 3 severe obesity with body mass index (BMI) of 60.0 to 69.9 in adult (CMS-HCC)    Headache, acute    Annual physical exam    SOB (shortness of breath)    Fatigue    Peripheral edema    Chronic low back pain without sciatica    Vitamin D  deficiency    Type 2 diabetes mellitus without complication, without long-term current use of insulin       Acquired hypothyroidism    Acute bilateral low back pain with sciatica    Graves disease    Mixed anxiety and depressive disorder    Hidradenitis suppurativa    Tobacco abuse    Rectal bleeding    Acute pain of right knee    Abnormal uterine bleeding (AUB)    Dizziness    Urinary urgency    Encounter for IUD insertion    Iron  deficiency anemia due to chronic blood loss    Encounter for routine checking of intrauterine contraceptive device (IUD)    Endometrial hyperplasia without atypia, complex    History of ADHD    Low back pain without sciatica    Difficulty  walking    Nausea    Allergic rhinitis due to allergen

## 2024-06-27 ENCOUNTER — Encounter
Admit: 2024-06-27 | Discharge: 2024-06-27 | Payer: Medicaid (Managed Care) | Attending: Student in an Organized Health Care Education/Training Program | Primary: Student in an Organized Health Care Education/Training Program

## 2024-06-27 DIAGNOSIS — E559 Vitamin D deficiency, unspecified: Principal | ICD-10-CM

## 2024-06-27 DIAGNOSIS — G4733 Obstructive sleep apnea (adult) (pediatric): Principal | ICD-10-CM

## 2024-06-27 DIAGNOSIS — M791 Myalgia, unspecified site: Principal | ICD-10-CM

## 2024-06-27 DIAGNOSIS — D500 Iron deficiency anemia secondary to blood loss (chronic): Principal | ICD-10-CM

## 2024-06-27 DIAGNOSIS — F418 Other specified anxiety disorders: Principal | ICD-10-CM

## 2024-06-27 DIAGNOSIS — R748 Abnormal levels of other serum enzymes: Principal | ICD-10-CM

## 2024-06-27 DIAGNOSIS — D72829 Elevated white blood cell count, unspecified: Principal | ICD-10-CM

## 2024-06-27 DIAGNOSIS — L732 Hidradenitis suppurativa: Principal | ICD-10-CM

## 2024-06-27 DIAGNOSIS — E0500 Thyrotoxicosis with diffuse goiter without thyrotoxic crisis or storm: Principal | ICD-10-CM

## 2024-06-27 DIAGNOSIS — E039 Hypothyroidism, unspecified: Principal | ICD-10-CM

## 2024-06-27 DIAGNOSIS — Z6841 Body Mass Index (BMI) 40.0 and over, adult: Principal | ICD-10-CM

## 2024-06-27 DIAGNOSIS — E66813 Class 3 severe obesity due to excess calories with body mass index (BMI) of 50.0 to 59.9 in adult, unspecified whether serious comorbidity present (CMS-HCC): Principal | ICD-10-CM

## 2024-06-27 DIAGNOSIS — Z1231 Encounter for screening mammogram for malignant neoplasm of breast: Principal | ICD-10-CM

## 2024-06-27 DIAGNOSIS — E119 Type 2 diabetes mellitus without complications: Principal | ICD-10-CM

## 2024-06-27 LAB — CBC W/ AUTO DIFF
BASOPHILS ABSOLUTE COUNT: 0.1 10*9/L (ref 0.0–0.1)
BASOPHILS RELATIVE PERCENT: 1.3 %
EOSINOPHILS ABSOLUTE COUNT: 0.7 10*9/L — ABNORMAL HIGH (ref 0.0–0.5)
EOSINOPHILS RELATIVE PERCENT: 6.7 %
HEMATOCRIT: 41.4 % (ref 34.0–44.0)
HEMOGLOBIN: 13.8 g/dL (ref 11.3–14.9)
LYMPHOCYTES ABSOLUTE COUNT: 3.1 10*9/L (ref 1.1–3.6)
LYMPHOCYTES RELATIVE PERCENT: 28.7 %
MEAN CORPUSCULAR HEMOGLOBIN CONC: 33.3 g/dL (ref 32.0–36.0)
MEAN CORPUSCULAR HEMOGLOBIN: 27.7 pg (ref 25.9–32.4)
MEAN CORPUSCULAR VOLUME: 83.4 fL (ref 77.6–95.7)
MEAN PLATELET VOLUME: 9.4 fL (ref 6.8–10.7)
MONOCYTES ABSOLUTE COUNT: 0.7 10*9/L (ref 0.3–0.8)
MONOCYTES RELATIVE PERCENT: 6.9 %
NEUTROPHILS ABSOLUTE COUNT: 6 10*9/L (ref 1.8–7.8)
NEUTROPHILS RELATIVE PERCENT: 56.4 %
PLATELET COUNT: 298 10*9/L (ref 150–450)
RED BLOOD CELL COUNT: 4.96 10*12/L (ref 3.95–5.13)
RED CELL DISTRIBUTION WIDTH: 15.9 % — ABNORMAL HIGH (ref 12.2–15.2)
WBC ADJUSTED: 10.7 10*9/L (ref 3.6–11.2)

## 2024-06-27 LAB — COMPREHENSIVE METABOLIC PANEL
ALBUMIN: 3.4 g/dL (ref 3.4–5.0)
ALKALINE PHOSPHATASE: 110 U/L (ref 46–116)
ALT (SGPT): 77 U/L — ABNORMAL HIGH (ref 10–49)
ANION GAP: 12 mmol/L (ref 5–14)
AST (SGOT): 53 U/L — ABNORMAL HIGH (ref <=34)
BILIRUBIN TOTAL: 0.3 mg/dL (ref 0.3–1.2)
BLOOD UREA NITROGEN: 10 mg/dL (ref 9–23)
BUN / CREAT RATIO: 19
CALCIUM: 9.7 mg/dL (ref 8.7–10.4)
CHLORIDE: 101 mmol/L (ref 98–107)
CO2: 25 mmol/L (ref 20.0–31.0)
CREATININE: 0.53 mg/dL — ABNORMAL LOW (ref 0.55–1.02)
EGFR CKD-EPI (2021) FEMALE: >90 mL/min/1.73m2 (ref >=60)
GLUCOSE RANDOM: 121 mg/dL (ref 70–179)
POTASSIUM: 4 mmol/L (ref 3.4–4.8)
PROTEIN TOTAL: 7.8 g/dL (ref 5.7–8.2)
SODIUM: 138 mmol/L (ref 135–145)

## 2024-06-27 LAB — T4, FREE: FREE T4: 1.13 ng/dL (ref 0.89–1.76)

## 2024-06-27 LAB — TSH: THYROID STIMULATING HORMONE: 8.679 u[IU]/mL — ABNORMAL HIGH (ref 0.550–4.780)

## 2024-06-27 LAB — ALBUMIN / CREATININE URINE RATIO
ALBUMIN QUANT URINE: 0.6 mg/dL
ALBUMIN/CREATININE RATIO: 3 ug/mg (ref 0.0–30.0)
CREATININE, URINE: 197.1 mg/dL

## 2024-06-27 MED ORDER — OZEMPIC 2 MG/DOSE (8 MG/3 ML) SUBCUTANEOUS PEN INJECTOR
SUBCUTANEOUS | 3 refills | 0.00000 days | Status: CP
Start: 2024-06-27 — End: 2024-06-27

## 2024-06-27 MED ORDER — BLOOD-GLUCOSE METER KIT WRAPPER
ORAL | 0 refills | 0.00000 days | Status: CP
Start: 2024-06-27 — End: 2024-06-27

## 2024-06-27 MED ORDER — METFORMIN 1,000 MG TABLET
ORAL_TABLET | Freq: Two times a day (BID) | ORAL | 1 refills | 90.00000 days | Status: CP
Start: 2024-06-27 — End: 2024-06-27

## 2024-06-27 MED ORDER — BLOOD GLUCOSE TEST STRIPS
ORAL_STRIP | Freq: Two times a day (BID) | 3 refills | 0.00000 days | Status: CP
Start: 2024-06-27 — End: 2024-06-27

## 2024-06-27 MED ORDER — LANCETS
11 refills | 0.00000 days | Status: CP
Start: 2024-06-27 — End: 2024-06-27

## 2024-06-27 MED FILL — EMPTY CONTAINER: 120 days supply | Qty: 1 | Fill #0

## 2024-06-27 NOTE — Unmapped (Signed)
 Upcoming Appt:  Future Appointments   Date Time Provider Department Center   08/19/2024  1:40 PM Lari Squires, FNP [id number]   10/16/2024  1:30 PM Rolland Sharyle Bradley, PA [id number]       Disposition:  See Physician Within 24 Hours    Encounter Reason for Disposition:    [1] Symptoms of high blood sugar (e.g., increased thirst, frequent urination, weight loss) AND [2] not able to test blood glucose      Is this a pediatric patient?   No     Any recent, relevant visit?   No  Has not been seen by her PCP since 06/2023    Any relevant medical history?   Yes     What history?  Type 2 DM (not on medications since last year due to financial issues).   OSA  Thyroid disease  Graves disease    Any interventions?   No      [facility] Eligibility    Patient is eligible for [facility] (according to most recent zip code and insurance information)?  No    Patient is [facility] eligible and appropriate for referral: No     Encounter Initial Assessment:  1. BLOOD GLUCOSE: What is your blood glucose level?       273 today around 10:30 am (06/27/24). Checked a couple of days ago and it was 230 then.   2. ONSET: When did you check the blood glucose?      As above.   3. USUAL RANGE: What is your glucose level usually? (e.g., usual fasting morning value, usual evening value)      Has not checked BS routinely but says in the past BS have been around 140-160.   4. KETONES: Do you check for ketones (urine or blood test strips)? If Yes, ask: What does the test show now?       No   5. TYPE 1 or 2:  Do you know what type of diabetes you have?  (e.g., Type 1, Type 2, Gestational; doesn't know)       Type 2  6. INSULIN: Do you take insulin? What type of insulin(s) do you use? What is the mode of delivery? (syringe, pen; injection or pump)?       No.   7. DIABETES PILLS: Do you take any pills for your diabetes? If Yes, ask: Have you missed taking any pills recently?      Does not take, stopped Metformin  and Ozempic  last August because of financial issues.   8. OTHER SYMPTOMS: Do you have any symptoms? (e.g., fever, frequent urination, difficulty breathing, dizziness, weakness, vomiting)      No fever.  Has frequent urination.  Intermittent dizzy spells with standing.  Feels a little unsteady on her feet today.  Denies weakness, nausea or vomiting.    9. PREGNANCY: Is there any chance you are pregnant? When was your last menstrual period?      Not pregnant.  Hysterectomy.      Encounter Protocols Used:  Diabetes - High Blood Sugar-A-AH

## 2024-06-27 NOTE — Unmapped (Signed)
-  Previously evaluated by Hematology For Iron  Def Anemia, last visit 08/29/2021  -Rechecking CBC. If still abnormal, will need peripheral smear     Orders:    CBC w/ Differential

## 2024-06-27 NOTE — Unmapped (Signed)
-  Currently not taking any medication   -Previously tried: Pristiq , Wellbutrin , Prozac , Zoloft (caused jaw clench), Fluxoetine + Buspar  (insomnia), Mirtazapine   -Consider starting Cymbalta vs TCA,

## 2024-06-27 NOTE — Unmapped (Signed)
 Copied from CRM #[id number]. Topic: Nurse Triage Scheduling - See in 24 hours  >> Jun 27, 2024 11:44 AM Mitzie Calin, RN wrote:  Jacque support request related to Appointment Scheduling  Primary Complaint/Symptom: Elevated Blood Sugars, Type 2 DM, not on meds  PASS Support Need: Schedule appointment within the triage disposition timing

## 2024-06-27 NOTE — Unmapped (Addendum)
-  Appears she was diagnosed in 10/2019 with A1C of 6.5%  -POCT A1C found to be 7.8%  -She has not taken her medication, which consists of METFORMIN  1000mg  BID, Ozempic  2mg  every 7 days, for >1 year  -She has not been checking her BG at home as does not have supplies but did check her BG with her mothers glucometer this morning and found to be 270s     -Rx for METFORMIN  1000mg  BID and Ozempic  2mg  every 7 days sent to pharmacy   -Will check CMP and urine microalbumin---> consider starting low dose ACEi/ARB if found to have elevated Albumin/Creatinine ratio    -Will need to discuss starting statin for CVD Prevention    -She would like more education regarding her diabetes. Will refer to NUTRITION SERVICES virtually (Not able to go to [facility] for Diabetic education, she deferred referral to CPP at this time)     -Referring to local Ophthalmology for DIABETIC EYE EXAM  -Additionally sent Rx for Glucometer+ supplies so she can check her BG with her own supplies     Orders:    Comprehensive Metabolic Panel    POCT glycosylated hemoglobin (Hb A1C)    Albumin/creatinine urine ratio    Ambulatory referral to Ophthalmology; Future    Ambulatory referral to Nutrition Services; Future    metFORMIN  (GLUCOPHAGE ) 1000 MG tablet; Take 1 tablet (1,000 mg total) by mouth in the morning and 1 tablet (1,000 mg total) in the evening. Take with meals.    semaglutide  (OZEMPIC ) 2 mg/dose (8 mg/3 mL) PnIj; Inject 2 mg subcutaneous once weekly

## 2024-06-27 NOTE — Unmapped (Addendum)
[facility] [phone]

## 2024-06-27 NOTE — Unmapped (Signed)
-  following with dermatology, Last visit on 06/19/2024 where it was recommended she start COSENTYX  (however does not appear to be taking at this time)

## 2024-06-27 NOTE — Unmapped (Signed)
 Patient ID: Kelly May, date of birth 07-Feb-1981 is a 43 y.o. female.    Date: June 27, 2024    Kelly May is a 43 y.o. female presenting regarding concern for her diabetes  Assessment & Plan  Type 2 diabetes mellitus without complication, without long-term current use of insulin     -Appears she was diagnosed in 10/2019 with A1C of 6.5%  -POCT A1C found to be 7.8%  -She has not taken her medication, which consists of METFORMIN  1000mg  BID, Ozempic  2mg  every 7 days, for >1 year  -She has not been checking her BG at home as does not have supplies but did check her BG with her mothers glucometer this morning and found to be 270s     -Rx for METFORMIN  1000mg  BID and Ozempic  2mg  every 7 days sent to pharmacy   -Will check CMP and urine microalbumin---> consider starting low dose ACEi/ARB if found to have elevated Albumin/Creatinine ratio    -Will need to discuss starting statin for CVD Prevention    -She would like more education regarding her diabetes. Will refer to NUTRITION SERVICES virtually (Not able to go to [facility] for Diabetic education, she deferred referral to CPP at this time)     -Referring to local Ophthalmology for DIABETIC EYE EXAM  -Additionally sent Rx for Glucometer+ supplies so she can check her BG with her own supplies     Orders:    Comprehensive Metabolic Panel    POCT glycosylated hemoglobin (Hb A1C)    Albumin/creatinine urine ratio    Ambulatory referral to Ophthalmology; Future    Ambulatory referral to Nutrition Services; Future    metFORMIN  (GLUCOPHAGE ) 1000 MG tablet; Take 1 tablet (1,000 mg total) by mouth in the morning and 1 tablet (1,000 mg total) in the evening. Take with meals.    semaglutide  (OZEMPIC ) 2 mg/dose (8 mg/3 mL) PnIj; Inject 2 mg subcutaneous once weekly    Class 3 severe obesity due to excess calories with body mass index (BMI) of 50.0 to 59.9 in adult, unspecified whether serious comorbidity present (CMS-HCC)  Wt Readings from Last 6 Encounters:   06/27/24 (!) 180.3 kg (397 lb 8 oz)   07/19/23 (!) (P) 169.5 kg (373 lb 9.6 oz)   07/12/23 (!) 168.1 kg (370 lb 11.2 oz)   07/09/23 (!) 166.5 kg (367 lb)   05/22/23 (!) 167.3 kg (368 lb 14.4 oz)   05/01/23 (!) 164.4 kg (362 lb 6.4 oz)     -Restarting Metformin  and Ozempic     Orders:    metFORMIN  (GLUCOPHAGE ) 1000 MG tablet; Take 1 tablet (1,000 mg total) by mouth in the morning and 1 tablet (1,000 mg total) in the evening. Take with meals.    semaglutide  (OZEMPIC ) 2 mg/dose (8 mg/3 mL) PnIj; Inject 2 mg subcutaneous once weekly    -Consider re-evaluation by bariatric surgery     Leukocytosis, unspecified type  Iron  deficiency anemia due to chronic blood loss  -Previously evaluated by Hematology For Iron  Def Anemia, last visit 08/29/2021  -Rechecking CBC. If still abnormal, will need peripheral smear     Orders:    CBC w/ Differential    Acquired hypothyroidism  Graves disease  -Previously evaluated by Endocrinology, Last visit on 07/20/2020 (?):   #Graves disease, + TSI: Peripheral thyroid levels have not been elevated on most recent laboratory studies but these need to be repeated today.  We discussed the long-term prognosis and disease course for Graves' disease.  She was  counseled on methimazole  precautions and potential side effects.  We will plan to treat with antithyroid therapy until she becomes euthyroid and titrate down to the lowest dose while maintaining euthyroid state.  - TFTs today and again in 4 weeks  - continue MMI 10 mg unless otherwise directed    -Currently not taking any medication. Rechecking TSH, Free T4 and pending results may need to refer back to endocrinology   Orders:    TSH    T4, Free    Vitamin D  deficiency  -Checking Vitamin D      Orders:    Vitamin D  25 Hydroxy (25OH D2 + D3)    Obstructive sleep apnea  -she repots having OSA >20 years.   -Recently saw Neurology/Sleep Clinic on 12/13/2023 where it was noted that she had sleep study on 06/24/2023 that revealed moderate-severe OSA  -She was not able to receive CPAP supplies.   -Phone number to [facility] provided to patient for her to call to obtain CPAP supplies  -       Elevated liver enzymes  -Noted on recent labs.   -Will repeat CMP but if LFT still elevated, will need US  liver to assess for MASLD       Mixed anxiety and depressive disorder  -Currently not taking any medication   -Previously tried: Pristiq , Wellbutrin , Prozac , Zoloft (caused jaw clench), Fluxoetine + Buspar  (insomnia), Mirtazapine   -Consider starting Cymbalta vs TCA,    Myalgia  -evaluated by Rheumatology on 01/29/2024:  Kelly May is a 43 y.o. female who is being seen for evaluation of whole body pain and elevated ESR. Patient is wondering if she has an autoimmune disease. Discussed with pt that I do not see symptoms of inflammatory arthritis such as Rheumatoid Arthritis (RA) as pain worse with activity, involves large joints, and no synovitis on exam, also would have expected RA or Spondyloarthritis to improve with Humira  which it did not. Neg ANA makes lupus or other connective tissue disease very unlikely and also does not have symptoms that are consistent. I am unsure of the etiology of her elevated ESR, very non-specific can be due to viral illness and also can be elevated in individuals with obesity. Elevated ESR is not concerning to me without clinical symptoms pointing to a rheumatic condition. I think most of her pain is due to combination of osteoarthritis and fibromyalgia        Hidradenitis suppurativa  -following with dermatology, Last visit on 06/19/2024 where it was recommended she start COSENTYX  (however does not appear to be taking at this time)        HEALTH MAINTENANCE   -due for screening mammogram; order placed      Mammo Screening Bilateral; Future  -Pap smear on 04/01/2020: NIL, HPV negative  -Will need to discuss immunizations   -FORMER SMOKER: stopped in 2022, smoked 1 PPD for 23 days        Return in about 4 weeks (around 07/25/2024) for New patient--establish care. Will consider obtaining TTE as previous concerns for HF    I personally spent 47 minutes face-to-face and non-face-to-face in the care of this patient, which includes all pre, intra, and post visit time on the date of service.  All documented time was specific to the E/M visit and does not include any procedures that may have been performed.        Chief Complaint:   Chief Complaint   Patient presents with    Diabetes  History of Present Illness  Kelly May is a 43 year old female with diabetes who presents with elevated blood sugar levels.    She has been experiencing elevated blood sugar levels, with a recent reading of 274 mg/dL after consuming only a cup of coffee. She has been off her diabetes medications, metformin  and Ozempic , for some time and is attempting to resume them. While on these medications, her blood sugar levels were typically between 140 and 160 mg/dL. She lacks her own glucose meter and infrequently checks her blood sugar using her mother's glucometer.    She experiences frequent lightheadedness upon standing, which has persisted for years, often necessitating the use of a walker for stability, although she did not bring it today. No vision changes such as spots or blurred vision are noted, but she mentions her eyes were 'a little gunky' today. Occasional swelling in her feet is also reported.    Her past medical history includes hypothyroidism, hyperthyroidism, and Graves' disease, and she has not been told of any thyroid issues since then. She has had sleep apnea since age 39 and has undergone a sleep study. Currently, she is without a CPAP machine due to difficulties in obtaining it.    She does not have a car and prefers virtual appointments. She expresses difficulty managing her diabetes and wants to learn more about nutrition and diabetes management.      I have reviewed past medical, surgical, medication, allergy, social and family histories today and updated them in Epic where appropriate.     Allergies:   Allergies as of 06/27/2024 - Reviewed 06/27/2024   Allergen Reaction Noted    Buspirone   01/09/2023    Prozac  [fluoxetine ]  01/09/2023       Problem List: Problem List[1]    The following information was reviewed by members of the visit team:  Allergies - Medications -          Vitals:    06/27/24 1439   BP: 120/80   Pulse: 104   SpO2: 95%   Weight: (!) 180.3 kg (397 lb 8 oz)   Height: 165.1 cm (5' 5)     Body mass index is 66.15 kg/m??.    Wt Readings from Last 3 Encounters:   06/27/24 (!) 180.3 kg (397 lb 8 oz)   07/19/23 (!) (P) 169.5 kg (373 lb 9.6 oz)   07/12/23 (!) 168.1 kg (370 lb 11.2 oz)       ROS: ROS negative unless otherwise noted in HPI.    EXAM:   Physical Exam  Constitutional:       Appearance: She is obese. She is not ill-appearing or diaphoretic.   HENT:      Head: Normocephalic and atraumatic.      Nose: Nose normal. No congestion.      Mouth/Throat:      Mouth: Mucous membranes are moist.   Eyes:      General:         Right eye: No discharge.         Left eye: No discharge.      Extraocular Movements: Extraocular movements intact.   Pulmonary:      Effort: Pulmonary effort is normal. No respiratory distress.   Musculoskeletal:         General: Normal range of motion.   Neurological:      General: No focal deficit present.      Mental Status: She is alert and oriented to person, place, and  time.   Psychiatric:         Mood and Affect: Mood normal.         Behavior: Behavior normal.                    [1]   Patient Active Problem List  Diagnosis    OSA (obstructive sleep apnea)    Depression    Class 3 severe obesity with body mass index (BMI) of 60.0 to 69.9 in adult (CMS-HCC)    Headache, acute    Annual physical exam    SOB (shortness of breath)    Fatigue    Peripheral edema    Chronic low back pain without sciatica    Vitamin D  deficiency    Type 2 diabetes mellitus without complication, without long-term current use of insulin Acquired hypothyroidism    Acute bilateral low back pain with sciatica    Graves disease    Mixed anxiety and depressive disorder    Hidradenitis suppurativa    Tobacco abuse    Rectal bleeding    Acute pain of right knee    Abnormal uterine bleeding (AUB)    Dizziness    Urinary urgency    Encounter for IUD insertion    Iron  deficiency anemia due to chronic blood loss    Encounter for routine checking of intrauterine contraceptive device (IUD)    Endometrial hyperplasia without atypia, complex    History of ADHD    Low back pain without sciatica    Difficulty walking    Nausea    Allergic rhinitis due to allergen

## 2024-06-27 NOTE — Unmapped (Addendum)
-  Previously evaluated by Endocrinology, Last visit on 07/20/2020 (?):   #Graves disease, + TSI: Peripheral thyroid levels have not been elevated on most recent laboratory studies but these need to be repeated today.  We discussed the long-term prognosis and disease course for Graves' disease.  She was counseled on methimazole  precautions and potential side effects.  We will plan to treat with antithyroid therapy until she becomes euthyroid and titrate down to the lowest dose while maintaining euthyroid state.  - TFTs today and again in 4 weeks  - continue MMI 10 mg unless otherwise directed    -Currently not taking any medication. Rechecking TSH, Free T4 and pending results may need to refer back to endocrinology   Orders:    TSH    T4, Free

## 2024-06-27 NOTE — Unmapped (Addendum)
-  Checking Vitamin D      Orders:    Vitamin D  25 Hydroxy (25OH D2 + D3)

## 2024-06-30 DIAGNOSIS — E039 Hypothyroidism, unspecified: Principal | ICD-10-CM

## 2024-06-30 DIAGNOSIS — E66813 Class 3 severe obesity due to excess calories with body mass index (BMI) of 50.0 to 59.9 in adult, unspecified whether serious comorbidity present (CMS-HCC): Principal | ICD-10-CM

## 2024-06-30 DIAGNOSIS — R748 Abnormal levels of other serum enzymes: Principal | ICD-10-CM

## 2024-06-30 DIAGNOSIS — E559 Vitamin D deficiency, unspecified: Principal | ICD-10-CM

## 2024-06-30 DIAGNOSIS — Z6841 Body Mass Index (BMI) 40.0 and over, adult: Principal | ICD-10-CM

## 2024-06-30 DIAGNOSIS — E0500 Thyrotoxicosis with diffuse goiter without thyrotoxic crisis or storm: Principal | ICD-10-CM

## 2024-06-30 LAB — VITAMIN D 25 HYDROXY: VITAMIN D, TOTAL (25OH): 16.3 ng/mL — ABNORMAL LOW (ref 20.0–80.0)

## 2024-06-30 MED ORDER — ERGOCALCIFEROL (VITAMIN D2) 1,250 MCG (50,000 UNIT) CAPSULE
ORAL_CAPSULE | ORAL | 0 refills | 84.00000 days | Status: CP
Start: 2024-06-30 — End: 2024-09-22
  Filled 2024-07-02: qty 12, 84d supply, fill #0

## 2024-07-01 NOTE — Unmapped (Signed)
 [facility] Outpatient Nutrition Services   Medical Nutrition Therapy Consultation       Visit Type:    Initial Assessment    Referral Reason:        Kelly May is a 43 y.o. female seen for medical nutrition therapy for management of type 2 diabetes and weight loss.     Her active problem list, medication list, allergies, family history, social history, health maintenance, and lab results were reviewed.     Her interim medical history is significant for Type 2 diabetes, Anemia, Anxiety, Arthritis, COPD, OSA and Obesity.    Anthropometrics   Height: 165.1 cm (5' 5)   Weight: (!) 180.3 kg (397 lb 8 oz)  Body mass index is 66.15 kg/m??.    Wt Readings from Last 5 Encounters:   07/01/24 (!) 180.3 kg (397 lb 8 oz)   06/27/24 (!) 180.3 kg (397 lb 8 oz)   07/19/23 (!) (P) 169.5 kg (373 lb 9.6 oz)   07/12/23 (!) 168.1 kg (370 lb 11.2 oz)   07/09/23 (!) 166.5 kg (367 lb)        Adjusted Body Weight : 106.3 kg    Ideal Body Weight: 56.75 kg  Weight in (lb) to have BMI = 25: 149.9    Nutrition Risk Screening:   Food Insecurity: Food Insecurity Present (01/23/2024)    Hunger Vital Sign     Worried About Running Out of Food in the Last Year: Sometimes true     Ran Out of Food in the Last Year: Sometimes true        Nutrition Focused Physical Exam:    Nutrition Evaluation  Overall Impressions: Unable to perform Nutrition-Focused Physical Exam at this time due to visit type - virtual  (07/01/24 0952)  Nutrition Designation: Obese class III  (BMI > 39.99 kg/m2) (07/01/24 0952)     Malnutrition Screening:   Patient does not meet AND/ASPEN criteria for malnutrition at this time (07/01/24 0952)    Biochemical Data, Medical Tests and Procedures:    Lab Results   Component Value Date    A1C 7.8 (A) 06/27/2024    GLU 121 06/27/2024    TSH 8.679 (H) 06/27/2024     Lab Results   Component Value Date    CHOL 154 01/12/2023    HDL 54 01/12/2023    LDL 79 01/12/2023    NONHDL 100 01/12/2023    TRIG 104 01/12/2023     Lab Results Component Value Date    WBC 10.7 06/27/2024    RBC 4.96 06/27/2024    HGB 13.8 06/27/2024    HCT 41.4 06/27/2024    MCV 83.4 06/27/2024    MCH 27.7 06/27/2024    MCHC 33.3 06/27/2024    RDW 15.9 (H) 06/27/2024    PLT 298 06/27/2024    MPV 9.4 06/27/2024     Lab Results   Component Value Date    IRON  39 (L) 01/12/2023    TIBC 393 01/12/2023    FERRITIN 7.7 01/12/2023     Lab Results   Component Value Date    ALKPHOS 110 06/27/2024    BILITOT 0.3 06/27/2024    PROT 7.8 06/27/2024    ALBUMIN 3.4 06/27/2024    ALT 77 (H) 06/27/2024    AST 53 (H) 06/27/2024     Medications and Vitamin/Mineral Supplementation:   All nutritionally pertinent medications/supplements reviewed on 07/02/2024.     Current Medications[1]    Nutrition History:  Dietary Restrictions: No known food allergies or food intolerances.     Gastrointestinal Issues: Denied issues    Hunger and Satiety: Denied issues.     Food Safety and Access: food insecurity noted      Diet Recall:   Time Intake     11 am  Biscuit with butter, coffee with creamer    5 pm  7 Chicken wings -air fried      Food-Related History:  Snacks:  peach, apple sauce, bananas, chips, ice cream bars, fruit bars   Beverages:  water, coffee,   Dining Out:  once per month   Usual Food Choices: chicken, beef, pork chops, hot dogs, sandwiches, rice, pasta, pizza  Meal Schedule:  1-2 meals 0-3 snack  per day    Eating Behaviors:  Overeating: Denied issues with overeating.   Emotional Eating: Denied issues.   Grazing: Denied issues.   Fast Eating: Denied issues.   Nighttime Eating: None.    Physical Activity:  Physical activity level is sedentary with little to no exercise.     Sleep : OSA not on CPAP at this time     Daily Estimated Nutritional Needs:  Energy: 2470 kcals [Per [place] [employer]-Jeor Equation using last recorded weight, 180.3 kg (07/02/24 1216)]  Protein: 85-106 gm [0.8-1 gm/kg using adjusted body weight, 106.3 kg (07/02/24 1216)]  Carbohydrate:   [45-60% of kcal]  Fluid: 2500 [ ]   Fat: total fat: 20-35% of kcal, saturated fat: < 7% of kcal , trans fat: as low as possible  Fiber: 25-30 g  Sodium:  <1500 mg   Cholesterol: <200 mg  Added Sugars: <10% of kcal     Nutrition Goals & Evaluation      Apply dietary recommendations to achieve goal weight of 150 lb recommended (New)     Apply dietary recommendations to achieve an A1C 6.0  (New)  ?   Sustainable weight loss of 5-10% (20-40) in the next 6 months (New)   ?   Meet estimated nutritional needs (New)   ?   Reduce obesity-related long-term health risk (New)   ?   Nutrition goals reviewed, and relevant barriers identified and addressed: nutrition knowledge deficit. Riyan is evaluated to have a willingness and ability to achieve nutrition goals.         Nutrition Assessment       Kjirsten has Obesity as evidenced by a BMI of 66.15 and abnormal A1C. Barriers to weight loss include metabolic effects of consumption of processed food and suboptimal physical activity.  Food history indicates usual diet is fair in protein, and non-starchy vegetables.    Usual dietary habits and food choices reveal a lack of variety in nutrient-dense foods and inadequate intake.    Nutrition Intervention      Nutrition Plan: healthy balance diet    Meet daily estimated needs    Keep intake consistent- aim for 5-6 small meals or at least 3 meals per day     Increase variety of nutrient dense food in diet     Include a source of protein with all meals    Increase physical activity to a goal of 150 minutes per week as able   Consider tracking your intake at least 3 days per week.      Education: Healthy diet/eating style.     Review healthy options for meals and snacks.     Discussed meal pattern and encouraged intake consistency.     Worked with patient to develop a  meal schedule.     Reviewed macronutrient balance in diet and provided examples.         Patient acknowledge understanding nutrition plan as presented.          Education resource (s: Advice worker     Lists of foods recommended, and foods not recommended   Sample menu plan(s)    Recipes supporting nutrition intervention   Online resources     After visit summary with patient instructions    How to build a diabetic plate        Follow up will occur in 4-12 weeks.            Food/Nutrition-related history, Anthropometric measurements, Biochemical data, medical tests, procedures, Nutrition-focused physical findings, Patient understanding or compliance with intervention and recommendations , Effectiveness of nutrition interventions, and Effectiveness of nutrition prescription/order   will be assessed at time of follow-up.           Recommendations for Referring Provider :  None at this time         The patient reports they are physically located in [place]  and is currently: at home. I conducted a audio/video visit. I spent  33m 12s on the video call with the patient. I spent an additional 20 minutes on pre- and post-visit activities on the date of service .         I am located on-site and the patient is located off-site for this visit.                  [1]   Current Outpatient Medications   Medication Sig Dispense Refill    blood sugar diagnostic (GLUCOSE BLOOD) Strp by Other route two (2) times a day. Type 2 diabetes;  test 2 times daily 200 strip 3    blood-glucose meter kit Use as instructed 1 each 0    desvenlafaxine  (PRISTIQ ) 50 MG 24 hr tablet Take 1 tablet (50 mg total) by mouth daily. (Patient not taking: Reported on 06/27/2024) 90 tablet 3    empty container (SHARPS CONTAINER) Misc Use as directed (Patient not taking: Reported on 06/27/2024) 1 each 2    empty container Misc Use as directed to dispose of Cosentyx  pens. (Patient not taking: Reported on 06/27/2024) 1 each 2    ergocalciferol -1,250 mcg, 50,000 unit, (DRISDOL ) 1,250 mcg (50,000 unit) capsule Take 1 capsule (1,250 mcg total) by mouth once a week. (Patient not taking: Reported on 06/27/2024) 12 capsule 3 ergocalciferol -1,250 mcg, 50,000 unit, (DRISDOL ) 1,250 mcg (50,000 unit) capsule Take 1 capsule (1,250 mcg total) by mouth once a week for 12 doses. 12 capsule 0    ferrous fumarate  324 mg (106 mg iron ) Tab Take 1 tablet (324 mg total) by mouth daily. (Patient not taking: Reported on 06/27/2024) 90 tablet 1    hydrocortisone  2.5 % ointment Apply 1 Application topically two (2) times a day. Apply to affected area twice daily until improved or for up to 2 weeks, whichever is sooner. Break for 1-2 weeks. Restart as needed. (Patient not taking: Reported on 06/27/2024) 28.35 g 1    ketoconazole  (NIZORAL ) 2 % cream Apply 1 Application topically two (2) times a day. (Patient not taking: Reported on 06/27/2024) 60 g 5    ketoconazole  (NIZORAL ) 2 % shampoo Apply to scalp 2-3x weekly. Lather, leave in for 5 minutes, then rinse. Can alternate with over-the-counter anti-dandruff shampoo such as Head & Shoulders. (Patient not taking: Reported on 06/27/2024) 120 mL 11  lancets Misc Type 2 diabetes, poorly controlled 250.02 Test 2 times daily 100 each 11    metFORMIN  (GLUCOPHAGE ) 1000 MG tablet Take 1 tablet (1,000 mg total) by mouth in the morning and 1 tablet (1,000 mg total) in the evening. Take with meals. 180 tablet 1    methocarbamol  (ROBAXIN ) 750 MG tablet Take 1 tablet (750 mg total) by mouth Three (3) times a day as needed. (Patient not taking: Reported on 06/27/2024) 60 tablet 0    nystatin  (MYCOSTATIN ) 100,000 unit/gram powder Apply to affected area 2-4 times a day (Patient not taking: Reported on 06/27/2024) 60 g 6    secukinumab  (COSENTYX  UNOREADY PEN) 300 mg/2 mL PnIj Inject the contents of 1 pen (300 mg) under the skin every twenty-eight (28) days. (Patient not taking: Reported on 06/27/2024) 2 mL 11    secukinumab  (COSENTYX  UNOREADY PEN) 300 mg/2 mL PnIj Inject the contents of 1 pen (300 mg) under the skin at weeks 0, 1, 2, 3, and 4 followed by 300 mg every 4 weeks (Patient not taking: Reported on 06/27/2024) 10 mL 0 semaglutide  (OZEMPIC ) 2 mg/dose (8 mg/3 mL) PnIj Inject 2 mg subcutaneous once weekly 9 mL 3    solifenacin  (VESICARE ) 10 MG tablet Take 1 tablet (10 mg total) by mouth daily. (Patient not taking: Reported on 06/27/2024) 30 tablet 11     No current facility-administered medications for this visit.

## 2024-07-02 MED FILL — KETOCONAZOLE 2 % SHAMPOO: TOPICAL | 28 days supply | Qty: 120 | Fill #0

## 2024-07-02 NOTE — Unmapped (Addendum)
 Nutrition Recommendations     Try to eat a minimum of 3 times per day or consider spreading meals out int 5-6 small meals. Avoid going more than 4 hours without eating.      Pay attention to your body. When you feel you have had enough to eat, stop.       Make sure you stay hydrated, drink plenty of water. You may be thirsty, not hungry       Monitor portion sizes. Consider serving sizes when preparing food or snacks. (you can find serving sizes on the food label)   Eat slowly. Put your fork down between bites.    ?   Nutrition    ?   Eat a rainbow of fruits and vegetables. Aim for 3 servings of fruits and 4 servings of non-starchy vegetables per day       Make half your plate fruits and vegetables,       A quarter of your plate protein (3 ounces) and the quarter of your plate whole grains. You can choose to replace the grains with more vegetables or legumes for some meals      Consider adding legumes to your diet they are a good source of protein and fiber       Reduce/avoid excess intake of sugary beverages,  such as sodas fruit juices and sports drinks     Limit intake of ultra-processed foods and snacks, such as refined grains, chips and crackers      Enjoy fruit for dessert in place of cakes, pies or other sweets   ?   ?   Food Preparation    ?   Pre- plan meals    Cook when you are not hungry and refrigerate meal for tomorrow lunch and/or dinner       Try to avoid adding fat when cooking. Consider  boiling, baking, broiling, roasting, or grilling foods   ?   Consider tracking your intake at least 3 days per week. Apps. Like Myfitness Pal can make it easy to track

## 2024-07-03 NOTE — Unmapped (Signed)
 Prior authorization for Ozempic  2mg /dose initated via CoverMyMeds.

## 2024-07-03 NOTE — Unmapped (Signed)
Sent via [facility]

## 2024-07-03 NOTE — Unmapped (Signed)
 Already forwarded to the correct PCP

## 2024-07-07 NOTE — Unmapped (Signed)
 Prior authorization for Ozempic  (2 MG/DOSE) 8MG /3ML pen-injectorshas been APPROVED. Patient and provider notified via MyChart.

## 2024-07-31 DIAGNOSIS — G4733 Obstructive sleep apnea (adult) (pediatric): Principal | ICD-10-CM

## 2024-07-31 DIAGNOSIS — E559 Vitamin D deficiency, unspecified: Principal | ICD-10-CM

## 2024-07-31 DIAGNOSIS — Z5986 Financial insecurity: Principal | ICD-10-CM

## 2024-07-31 DIAGNOSIS — E119 Type 2 diabetes mellitus without complications: Principal | ICD-10-CM

## 2024-07-31 DIAGNOSIS — R4789 Other speech disturbances: Principal | ICD-10-CM

## 2024-07-31 DIAGNOSIS — R110 Nausea: Principal | ICD-10-CM

## 2024-07-31 DIAGNOSIS — R413 Other amnesia: Principal | ICD-10-CM

## 2024-07-31 DIAGNOSIS — F418 Other specified anxiety disorders: Principal | ICD-10-CM

## 2024-07-31 DIAGNOSIS — M5440 Lumbago with sciatica, unspecified side: Principal | ICD-10-CM

## 2024-07-31 DIAGNOSIS — Z720 Tobacco use: Principal | ICD-10-CM

## 2024-07-31 DIAGNOSIS — E039 Hypothyroidism, unspecified: Principal | ICD-10-CM

## 2024-07-31 MED ORDER — HYDROXYZINE HCL 25 MG TABLET
ORAL_TABLET | Freq: Three times a day (TID) | ORAL | 0 refills | 30.00000 days | Status: CP | PRN
Start: 2024-07-31 — End: 2024-08-30
  Filled 2024-08-11: qty 90, 30d supply, fill #0

## 2024-07-31 MED ORDER — ONDANSETRON 4 MG DISINTEGRATING TABLET
ORAL_TABLET | Freq: Three times a day (TID) | 1 refills | 10.00000 days | Status: CP | PRN
Start: 2024-07-31 — End: 2024-08-30
  Filled 2024-08-11: qty 30, 10d supply, fill #0

## 2024-07-31 NOTE — Unmapped (Signed)
 Patient ID: Kelly May, date of birth 1981-07-25 is a 43 y.o. female.    Date: August 02, 2024    Assessment & Plan      Kelly May is a 43 y.o. female presenting to formally establish care  Assessment & Plan  Acquired hypothyroidism  Graves disease  -Previously evaluated by Endocrinology, Last visit on 07/20/2020 (?):   #Graves disease, + TSI: Peripheral thyroid levels have not been elevated on most recent laboratory studies but these need to be repeated today.  We discussed the long-term prognosis and disease course for Graves' disease.  She was counseled on methimazole  precautions and potential side effects.  We will plan to treat with antithyroid therapy until she becomes euthyroid and titrate down to the lowest dose while maintaining euthyroid state.  - TFTs today and again in 4 weeks  - continue MMI 10 mg unless otherwise directed     -Labs from 06/27/2024: TSH 8.679, Free T4 1.13  -Given complex medical history regarding her thyroid, she was referred to Endocrinology --> Visit scheduled on 08/04/2024  -Will obtain thyroid Ultrasound    Orders:    US  Thyroid; Future    Type 2 diabetes mellitus without complication, without long-term current use of insulin    (CMS-HCC)  -A1C was found to be 7.8% on 06/27/2024   -Currently taking Metformin  1000mg  BID.   -She is NOT taking Ozempic  due to cost   -Referral to CAMP placed to assist for affordability concerns    Orders:    [facility] ([facility]) Visit; Future    -She was referred to nutritionist and evaluated on 07/01/2024  -Consider re-evaluation with Bariatric surgery   -Consider starting low dose statin for CVD prevention    -Eye exam is still due   -Foot exam due in 07/2025, Urine Microalbumin due 06/2025  OSA (obstructive sleep apnea)  -she repots having OSA >20 years.   -Recently saw Neurology/Sleep Clinic on 12/13/2023 where it was noted that she had sleep study on 06/24/2023 that revealed moderate-severe OSA  -she does have sleep apnea but has not been able to receive CPAP supplies   -she is going to resschedule sleep study       Vitamin D  deficiency  -To continue Vitamin D  supplement       Tobacco abuse  -FORMER SMOKER: stopped in 2022, smoked 1 PPD for 23 days--> Will qualify for LDCT at 51       Mixed anxiety and depressive disorder  -PHQ-9 score: 13  -GAD-7 score 17    -Previously tried: Pristiq , Wellbutrin , Prozac , Zoloft (caused jaw clench), Fluxoetine + Buspar  (insomnia), Mirtazapine     -She does note concerns regarding anxiety and depression but does not wish to start a daily medication.   -Will prescribe Hydroxyzine  25mg  PRN  Orders:    hydrOXYzine  (ATARAX ) 25 MG tablet; Take 1 tablet (25 mg total) by mouth Three (3) times a day as needed for anxiety (or sleep).    Financial insecurity  -referral placed to CAMP as she notes financial insecurity when it comes to affording medication  Orders:    [facility] ([facility]) Visit; Future    Nausea  -per patient request, prescribing Zofran  to take PRN for nausea when she takes GLP-1 to reduce ADE/SE  Orders:    ondansetron  (ZOFRAN -ODT) 4 MG disintegrating tablet; Dissolve 1 tablet (4 mg total) in the mouth every eight (8) hours as needed for nausea.    Poor memory  Memory impairment  Word finding difficulty  -she reports significant memory impairment.   -Additionally FH of CVA  -We will start initial work up with MRI Brain W/Wo contrast   Orders:    MRI Brain W Wo Contrast; Future    -Pending results, discussed possible evaluation with Neuropsychology for evaluation        HEALTH MAINTENANCE   -Pap smear on 04/01/2020: NIL, HPV negative--> repeat in 03/2025  -HS: following with dermatology --> taking Cosentyx   -Myalgia: evaluated by rheumatology on 01/29/2024  -Encouraged to schedule Mammogram      Return in about 2 months (around 09/30/2024) for chronic care follow up . We will repeat labs at this visit    Chief Complaint:   Chief Complaint   Patient presents with Establish Care          History of Present Illness  Kelly May is a 43 year old female with anxiety who presents with worsening anxiety symptoms.    She experiences significant anxiety, particularly triggered by loud noises such as mowing the grass, which causes her nerves to be 'tore up the entire day'. She also feels anxious in large crowds and has a persistent feeling that people are looking at her when she is out. She is not currently taking Pristiq  due to financial constraints and reports that it was not particularly helpful.    She is currently taking metformin , methocarbamol , a nasal spray for allergies, vitamin D , and Cosentyx . She has not been able to pick up Ozempic  yet and experiences nausea and diarrhea when taking it, for which she uses Zofran . She has been monitoring her blood sugar levels at home, with recent readings ranging between 120 and 150, compared to previous levels of 190 to 200. She has made dietary changes, including reducing sugar intake and increasing vegetable consumption.    She has a history of fluctuating thyroid function, with past diagnoses of both hyperthyroidism and hypothyroidism, and mentions a history of Graves' disease. She is scheduled to see an endocrinologist for further evaluation.    She reports memory issues that have been worsening over the past few years, with significant memory loss regarding her children's lives and her own childhood. There is no known family history of Alzheimer's or dementia, but her grandmother had strokes and mini-strokes. She has a history of ADHD, which she feels contributes to her difficulty finding words. No sudden weakness or speech issues.    She has not been able to reschedule her CPAP supplies appointment after it was initially pushed back.        Allergies:   Allergies as of 07/31/2024 - Reviewed 07/31/2024   Allergen Reaction Noted    Buspirone   01/09/2023    Prozac  [fluoxetine ]  01/09/2023       Problem List: Problem List[1]    The following information was reviewed by members of the visit team:  Allergies - Medications -          Vitals:    07/31/24 1556   BP: 136/91   Pulse: 114   Temp: 37 ??C (98.6 ??F)   SpO2: 97%   Weight: (!) 175.2 kg (386 lb 4.8 oz)   Height: 162.6 cm (5' 4)     Body mass index is 66.31 kg/m??.    Wt Readings from Last 3 Encounters:   07/31/24 (!) 175.2 kg (386 lb 4.8 oz)   07/01/24 (!) 180.3 kg (397 lb 8 oz)   06/27/24 (!) 180.3 kg (397 lb 8  oz)       ROS: ROS negative unless otherwise noted in HPI.    EXAM:   Physical Exam  Constitutional:       Appearance: Normal appearance. She is obese. She is not ill-appearing or diaphoretic.   HENT:      Head: Normocephalic and atraumatic.      Nose: No congestion.      Mouth/Throat:      Mouth: Mucous membranes are moist.   Eyes:      General:         Right eye: No discharge.         Left eye: No discharge.      Extraocular Movements: Extraocular movements intact.      Conjunctiva/sclera: Conjunctivae normal.   Cardiovascular:      Rate and Rhythm: Normal rate.   Pulmonary:      Effort: Pulmonary effort is normal.   Musculoskeletal:      Comments: Ambulating with walker   Neurological:      General: No focal deficit present.      Mental Status: She is alert and oriented to person, place, and time.      Cranial Nerves: No cranial nerve deficit.   Psychiatric:         Mood and Affect: Mood normal.         Behavior: Behavior normal.       Foot Exam Performed: Brief Foot Exam (Monofilament)   Monofilament Test: 8 of 8 sites normal  Skin: Normal  Nails: Normal  Foot Deformities: None             [1]   Patient Active Problem List  Diagnosis    OSA (obstructive sleep apnea)    Depression    Class 3 severe obesity with body mass index (BMI) of 60.0 to 69.9 in adult (CMS-HCC)    Headache, acute    Annual physical exam    SOB (shortness of breath)    Fatigue    Peripheral edema    Chronic low back pain without sciatica    Vitamin D  deficiency    Type 2 diabetes mellitus without complication, without long-term current use of insulin    (CMS-HCC)    Acquired hypothyroidism    Acute bilateral low back pain with sciatica    Graves disease    Mixed anxiety and depressive disorder    Hidradenitis suppurativa    Tobacco abuse    Rectal bleeding    Acute pain of right knee    Abnormal uterine bleeding (AUB)    Dizziness    Urinary urgency    Encounter for IUD insertion    Iron  deficiency anemia due to chronic blood loss    Encounter for routine checking of intrauterine contraceptive device (IUD)    Endometrial hyperplasia without atypia, complex    History of ADHD    Low back pain without sciatica    Difficulty walking    Nausea    Allergic rhinitis due to allergen

## 2024-07-31 NOTE — Unmapped (Addendum)
-  To continue Vitamin D  supplement

## 2024-07-31 NOTE — Unmapped (Addendum)
-  she repots having OSA >20 years.   -Recently saw Neurology/Sleep Clinic on 12/13/2023 where it was noted that she had sleep study on 06/24/2023 that revealed moderate-severe OSA  -she does have sleep apnea but has not been able to receive CPAP supplies   -she is going to resschedule sleep study

## 2024-07-31 NOTE — Unmapped (Addendum)
-  FORMER SMOKER: stopped in 2022, smoked 1 PPD for 23 days--> Will qualify for LDCT at 51

## 2024-07-31 NOTE — Unmapped (Addendum)
-  Previously evaluated by Endocrinology, Last visit on 07/20/2020 (?):   #Graves disease, + TSI: Peripheral thyroid levels have not been elevated on most recent laboratory studies but these need to be repeated today.  We discussed the long-term prognosis and disease course for Graves' disease.  She was counseled on methimazole  precautions and potential side effects.  We will plan to treat with antithyroid therapy until she becomes euthyroid and titrate down to the lowest dose while maintaining euthyroid state.  - TFTs today and again in 4 weeks  - continue MMI 10 mg unless otherwise directed     -Labs from 06/27/2024: TSH 8.679, Free T4 1.13  -Given complex medical history regarding her thyroid, she was referred to Endocrinology --> Visit scheduled on 08/04/2024  -Will obtain thyroid Ultrasound    Orders:    US  Thyroid; Future

## 2024-07-31 NOTE — Unmapped (Addendum)
-  PHQ-9 score: 13  -GAD-7 score 17    -Previously tried: Pristiq , Wellbutrin , Prozac , Zoloft (caused jaw clench), Fluxoetine + Buspar  (insomnia), Mirtazapine     -She does note concerns regarding anxiety and depression but does not wish to start a daily medication.   -Will prescribe Hydroxyzine  25mg  PRN  Orders:    hydrOXYzine  (ATARAX ) 25 MG tablet; Take 1 tablet (25 mg total) by mouth Three (3) times a day as needed for anxiety (or sleep).

## 2024-07-31 NOTE — Unmapped (Addendum)
-  A1C was found to be 7.8% on 06/27/2024   -Currently taking Metformin  1000mg  BID.   -She is NOT taking Ozempic  due to cost   -Referral to CAMP placed to assist for affordability concerns    Orders:    [facility] ([facility]) Visit; Future    -She was referred to nutritionist and evaluated on 07/01/2024  -Consider re-evaluation with Bariatric surgery   -Consider starting low dose statin for CVD prevention    -Eye exam is still due   -Foot exam due in 07/2025, Urine Microalbumin due 06/2025

## 2024-07-31 NOTE — Unmapped (Addendum)
-  per patient request, prescribing Zofran  to take PRN for nausea when she takes GLP-1 to reduce ADE/SE  Orders:    ondansetron  (ZOFRAN -ODT) 4 MG disintegrating tablet; Dissolve 1 tablet (4 mg total) in the mouth every eight (8) hours as needed for nausea.

## 2024-08-01 NOTE — Unmapped (Signed)
 [facility] ([facility])  RECRUITMENT SUMMARY NOTE   Workqueue Referral (Unscheduled)      Patient was referred for CAMP services. Patient having trouble affording her medication copays.         Leotis Figures, CPhT   Certified Pharmacy Technician    [facility] Medic[facility] Program ([facility])   P [phone], F [phone]

## 2024-08-03 NOTE — Unmapped (Signed)
-  Previously evaluated by Endocrinology, Last visit on 07/20/2020 (?):   #Graves disease, + TSI: Peripheral thyroid levels have not been elevated on most recent laboratory studies but these need to be repeated today.  We discussed the long-term prognosis and disease course for Graves' disease.  She was counseled on methimazole  precautions and potential side effects.  We will plan to treat with antithyroid therapy until she becomes euthyroid and titrate down to the lowest dose while maintaining euthyroid state.  - TFTs today and again in 4 weeks  - continue MMI 10 mg unless otherwise directed     -Labs from 06/27/2024: TSH 8.679, Free T4 1.13  -Given complex medical history regarding her thyroid, she was referred to Endocrinology --> Visit scheduled on 08/04/2024  -Will obtain thyroid Ultrasound    Orders:    US  Thyroid; Future

## 2024-08-03 NOTE — Unmapped (Signed)
  Endocrinology at [facility] Hyperthyroidism Consult Note    Referring Provider: Buel Mow  Primary Provider: Keven Crumbly Pap, DO    Assessment & Plan  Subclinical hypothyroidism  History of hypothyroidism and Graves' disease  Currently with subclinical hypothyroidism (TSH ~8, normal FT4). She is clinically euthyroid. She actually has a history of TSI-positive Graves' disease briefly on thioamide in 2022 before entering remission, as well as a more remote history of hypothyroidism briefly on thyroid hormone replacement. At current, she is not on any thyroid therapies, and would consider thyroid hormone replacement if her TSH >10 or she has significant symptoms of hypothyroidism.   - Continue to monitor clinically and biochemically for now. Can consider thyroid hormone replacement if TSH >10.   - Recheck TFTs today  - Recheck TSI and TPO Ab today  - Discussed possibility of fluctuating thyroid activity and need for ongoing monitoring.    Return in about 3 months (around 11/03/2024).    Orders Placed This Encounter   Procedures    TSH    T4, Free    T3, Free    Thyroid Peroxidase Antibody    Thyroid stimulating immunoglobulin         Subjective     History of Present Illness:  Kelly May is an 43 y.o. female seen at the request of Dr. Buel Mow in consultation for evaluation of Graves' disease.     History of Present Illness  She was diagnosed with Graves' disease during the COVID-19 pandemic, around 2021, and was prescribed methimazole  10 mg once daily. However, she only took the medication for about one to two months due to financial constraints. By June 2022, her lab results were normal. She has a history of hypothyroidism diagnosed around 2014, prior to the Graves' disease diagnosis. She was on levothyroxine for about a year, approximately four to five years before the Graves' diagnosis.    She experiences significant fatigue and pain, particularly in her back, legs, and sometimes neck, which limits her ability to stand for more than two minutes. She is not currently on disability but is actively trying to obtain it. She has mild sleep apnea and is working on obtaining a CPAP machine. She takes vitamin D  and plans to start iron  supplements due to iron  deficiency.    Her weight has decreased from 397-398 pounds to 383 pounds over the past few weeks, attributed to working with a nutritionist. She has a history of using Ozempic  for weight loss, which she discontinued a year ago following a hysterectomy due to severe bleeding associated with PCOS. She has not resumed Ozempic  since then.    She reports dry skin, occasional oily skin, and some hair thinning. She experiences frequent loose stools and occasional constipation. There is no known family history of thyroid problems. She had a thyroid ultrasound in 2021, which showed signs of autoimmune thyroiditis.    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 She lives with her children, two of whom are aged ten and 61, while her 23 year old son is expected to move back in with her soon.      Past Medical History[1]    Past Surgical History[2]    Current Medications[3]    Allergies[4]    Family History[5]    Social History     Social History Narrative    Not on file       Review of Systems: 12 Systems reviewed and otherwise negative  except as noted in HPI.      Objective     Physical Exam:  Vitals:    08/04/24 1118   BP: 133/92   Pulse: 89     Wt Readings from Last 6 Encounters:   08/04/24 (!) 173.9 kg (383 lb 6.4 oz)   07/31/24 (!) 175.2 kg (386 lb 4.8 oz)   07/01/24 (!) 180.3 kg (397 lb 8 oz)   06/27/24 (!) 180.3 kg (397 lb 8 oz)   07/19/23 (!) (P) 169.5 kg (373 lb 9.6 oz)   07/12/23 (!) 168.1 kg (370 lb 11.2 oz)     Physical Exam  Constitutional:       General: She is not in acute distress.     Appearance: She is obese.      Comments: Seated on walker   HENT:      Head: Normocephalic.      Mouth/Throat:      Mouth: Mucous membranes are moist.   Eyes:      General: No scleral icterus.     Extraocular Movements: Extraocular movements intact.      Conjunctiva/sclera: Conjunctivae normal.   Neck:      Thyroid: No thyroid tenderness.      Comments: Large neck girth, difficult to palpate thyroid, no obvious nodularity  Cardiovascular:      Rate and Rhythm: Normal rate and regular rhythm.   Pulmonary:      Effort: Pulmonary effort is normal. No respiratory distress.      Breath sounds: Normal breath sounds.   Abdominal:      General: Abdomen is flat. There is no distension.      Palpations: Abdomen is soft.      Tenderness: There is no abdominal tenderness.   Musculoskeletal:      Right lower leg: No edema.      Left lower leg: No edema.   Lymphadenopathy:      Cervical: No cervical adenopathy.   Skin:     General: Skin is warm.      Capillary Refill: Capillary refill takes less than 2 seconds.   Neurological:      General: No focal deficit present.      Mental Status: She is alert and oriented to person, place, and time.      Motor: No tremor.   Psychiatric:         Mood and Affect: Mood normal.         Behavior: Behavior normal.         Lab Review  Results      TSH   Date Value   08/04/2024 5.976 uIU/mL (H)   06/27/2024 8.679 uIU/mL (H)   07/19/2023 2.399 uIU/mL   01/12/2023 2.098 uIU/mL   04/06/2022 3.491 uIU/mL   01/01/2020 <0.005 uIU/mL (L)   01/30/2013 2.33 MICROIU/ML     T3, Free (pg/mL)   Date Value   04/06/2022 2.90   07/20/2020 3.03   04/01/2020 5.49     Free T4 (ng/dL)   Date Value   90/91/7974 1.06   06/27/2024 1.13   04/06/2022 1.00   04/28/2021 1.18   07/20/2020 0.98   01/01/2020 1.56     Thyroid Peroxidase Ab (IU/mL)   Date Value   11/17/2019 0.64     Thyroid Stimulating Immunoglob (IU/L)   Date Value   01/01/2020 0.82 (H)     Office Visit on 08/04/2024   Component Date Value Ref Range Status    TSH 08/04/2024 5.976 (H)  0.550 - 4.780 uIU/mL Final    Free T4 08/04/2024 1.06  0.89 - 1.76 ng/dL Final   Office Visit on 06/27/2024   Component Date Value Ref Range Status    Sodium 06/27/2024 138  135 - 145 mmol/L Final    Potassium 06/27/2024 4.0  3.4 - 4.8 mmol/L Final    Chloride 06/27/2024 101  98 - 107 mmol/L Final    CO2 06/27/2024 25.0  20.0 - 31.0 mmol/L Final    Anion Gap 06/27/2024 12  5 - 14 mmol/L Final    BUN 06/27/2024 10  9 - 23 mg/dL Final    Creatinine 91/98/7974 0.53 (L)  0.55 - 1.02 mg/dL Final    BUN/Creatinine Ratio 06/27/2024 19   Final    eGFR CKD-EPI (2021) Female 06/27/2024 >90  >=60 mL/min/1.61m2 Final    eGFR calculated with CKD-EPI 2021 equation in accordance with [employer] and [employer] of Nephrology Task Force recommendations.    Glucose 06/27/2024 121  70 - 179 mg/dL Final    Calcium 91/98/7974 9.7  8.7 - 10.4 mg/dL Final    Albumin 91/98/7974 3.4  3.4 - 5.0 g/dL Final    Total Protein 06/27/2024 7.8  5.7 - 8.2 g/dL Final    Total Bilirubin 06/27/2024 0.3  0.3 - 1.2 mg/dL Final    AST 91/98/7974 53 (H)  <=34 U/L Final    ALT 06/27/2024 77 (H)  10 - 49 U/L Final    Alkaline Phosphatase 06/27/2024 110  46 - 116 U/L Final    Hemoglobin A1C 06/27/2024 7.8 (A)  4.0 - 6.0 % Final    A1C LOT NBR 06/27/2024 .   Final    A1C STRIP EXP 06/27/2024 .   Final    Creat U 06/27/2024 197.1  Undefined mg/dL Final    Albumin Quantitative, Urine 06/27/2024 0.6  Undefined mg/dL Final    Albumin/Creatinine Ratio 06/27/2024 3.0  0.0 - 30.0 ug/mg Final    TSH 06/27/2024 8.679 (H)  0.550 - 4.780 uIU/mL Final    Free T4 06/27/2024 1.13  0.89 - 1.76 ng/dL Final    Vitamin D  Total (25OH) 06/27/2024 16.3 (L)  20.0 - 80.0 ng/mL Final    WBC 06/27/2024 10.7  3.6 - 11.2 10*9/L Final    RBC 06/27/2024 4.96  3.95 - 5.13 10*12/L Final    HGB 06/27/2024 13.8  11.3 - 14.9 g/dL Final    HCT 91/98/7974 41.4  34.0 - 44.0 % Final    MCV 06/27/2024 83.4  77.6 - 95.7 fL Final    MCH 06/27/2024 27.7  25.9 - 32.4 pg Final    MCHC 06/27/2024 33.3  32.0 - 36.0 g/dL Final    RDW 91/98/7974 15.9 (H)  12.2 - 15.2 % Final    MPV 06/27/2024 9.4  6.8 - 10.7 fL Final    Platelet 06/27/2024 298  150 - 450 10*9/L Final    Neutrophils % 06/27/2024 56.4  % Final    Lymphocytes % 06/27/2024 28.7  % Final    Monocytes % 06/27/2024 6.9  % Final    Eosinophils % 06/27/2024 6.7  % Final    Basophils % 06/27/2024 1.3  % Final    Absolute Neutrophils 06/27/2024 6.0  1.8 - 7.8 10*9/L Final    Absolute Lymphocytes 06/27/2024 3.1  1.1 - 3.6 10*9/L Final    Absolute Monocytes 06/27/2024 0.7  0.3 - 0.8 10*9/L Final    Absolute Eosinophils 06/27/2024 0.7 (H)  0.0 - 0.5 10*9/L  Final    Absolute Basophils 06/27/2024 0.1  0.0 - 0.1 10*9/L Final   Appointment on 06/19/2024   Component Date Value Ref Range Status    ALT 06/19/2024 77 (H)  10 - 49 U/L Final    AST 06/19/2024 57 (H)  <=34 U/L Final    BUN 06/19/2024 9  9 - 23 mg/dL Final    Creatinine 92/75/7974 0.54 (L)  0.55 - 1.02 mg/dL Final    eGFR CKD-EPI (2021) Female 06/19/2024 >90  >=60 mL/min/1.55m2 Final    eGFR calculated with CKD-EPI 2021 equation in accordance with [employer] and [employer] of Nephrology Task Force recommendations.    Hep B Core Total Ab 06/19/2024 Nonreactive  Nonreactive Final    Hep B Surface Ag 06/19/2024 Nonreactive  Nonreactive Final    Hep B S Ab 06/19/2024 Nonreactive  Nonreactive, Grayzone Final    Hep B Surf Ab Quant 06/19/2024 <8.00  <8.00 m(IU)/mL Final    Hepatitis C Ab 06/19/2024 Nonreactive  Nonreactive Final    Antibodies to HCV were not detected.  A nonreactive result does not exclude the possibility of exposure to HCV.    HCV S/CO VALUE 06/19/2024 0.14   Final    HIV Antigen/Antibody Combo 06/19/2024 Nonreactive  Nonreactive Final    HIV-1 p24 Ag and HIV-1/HIV-2 Ab were NOT DETECTED in this sample.  The HIV-1/2 antigen antibody combination test (4th generation) detects both HIV-1/HIV-2 antibody and HIV-1 p24 antigen.      Results Verified by Slide Scan 06/19/2024 Slide Reviewed   Final    WBC 06/19/2024 11.6 (H)  3.6 - 11.2 10*9/L Final    RBC 06/19/2024 5.37 (H)  3.95 - 5.13 10*12/L Final    HGB 06/19/2024 14.8  11.3 - 14.9 g/dL Final    HCT 92/75/7974 44.6 (H)  34.0 - 44.0 % Final    MCV 06/19/2024 83.2  77.6 - 95.7 fL Final    MCH 06/19/2024 27.7  25.9 - 32.4 pg Final    MCHC 06/19/2024 33.3  32.0 - 36.0 g/dL Final    RDW 92/75/7974 16.4 (H)  12.2 - 15.2 % Final    MPV 06/19/2024    Final    unable to calculate    Platelet 06/19/2024    Final    clumped platelets present; accurate platelet count not available    nRBC 06/19/2024 0  <=4 /100 WBCs Final    Neutrophils % 06/19/2024 57.9  % Final    Lymphocytes % 06/19/2024 28.1  % Final    Monocytes % 06/19/2024 7.3  % Final    Eosinophils % 06/19/2024 5.4  % Final    Basophils % 06/19/2024 1.3  % Final    Absolute Neutrophils 06/19/2024 6.8  1.8 - 7.8 10*9/L Final    Absolute Lymphocytes 06/19/2024 3.3  1.1 - 3.6 10*9/L Final    Absolute Monocytes 06/19/2024 0.8  0.3 - 0.8 10*9/L Final    Absolute Eosinophils 06/19/2024 0.6 (H)  0.0 - 0.5 10*9/L Final    Absolute Basophils 06/19/2024 0.1  0.0 - 0.1 10*9/L Final    Quantiferon TB Gold Plus Interpret* 06/19/2024 Negative  Negative Final    Quantiferon TB NIL value 06/19/2024 0.06  IU/mL Final    Quantiferon Mitogen Minus Nil 06/19/2024 9.94  IU/mL Final    Quantiferon Antigen 1 minus Nil 06/19/2024 0.01  IU/mL Final    Quantiferon Antigen 2 minus NIL 06/19/2024 0.03  IU/mL Final    TB NIL VALUE 06/19/2024 0.06  Final    TB AG1 VALUE 06/19/2024 0.07   Final    TB AG2 VALUE 06/19/2024 0.09   Final    TB Mitogen 06/19/2024 10.00   Final       Radiology:  Reviewed. Pertinent findings include:     Impression   -Heterogenous thyroid with micronodularity, likely related to autoimmune thyroiditis.  No suspicious thyroid nodules identified.      ________________________________   Please see below for data measurements:   Right thyroid: length 4.11 cm; width 1.86 cm; height 1.52 cm   Left thyroid: length 4.30 cm; width 1.61 cm; height 1.68 cm   Isthmus: 0.52 cm Lymph node measurements:   Right I  0.73  cm   Right III 0.78 cm   Right V 0.57  cm      Left I 0.54 cm   Left V 0.53  cm     Narrative   EXAM: US  THYROID   DATE: 12/22/2019 4:19 PM   ACCESSION: [id number]   DICTATED: 12/22/2019 4:20 PM   INTERPRETATION LOCATION: [facility]      CLINICAL INDICATION: 43 years old Female with hyperthyroidism  - R79.89 - Abnormal TSH      COMPARISON: None available.      TECHNIQUE:  Ultrasound views of the thyroid were obtained using gray scale and limited color Doppler imaging.      FINDINGS:   Somewhat limited study overall due to patient body habitus.      Thyroid size: see below      Thyroid echotexture: Diffusely mildly heterogenous thyroid. No suspicious thyroid nodules identified. Multiple subcentimeter hypoechoic lesions in the bilateral thyroid, which does not meet TIRADS criteria for follow-up.      Lymph nodes: No adenopathy       Other Medical Data:  Reviewed and summarized records in EPIC/Media and via CareEverywhere for purposes of today's visit. Any pertinent data is included in the note above.      This record has been created using [employer] AI-powered clinical conversation platform. Chart creation errors have been sought, but may not always have been located. Such creation errors do not reflect on the standard of medical care.         [1]   Past Medical History:  Diagnosis Date    Abnormal Pap smear of cervix     Abnormal TSH 11/27/2019    Acne     Acquired hypothyroidism 11/27/2019    Acute bilateral low back pain with sciatica     Allergic     Animal and pollen    Anemia     Anxiety     Arthritis 2021?    Arthritis in knees    COPD (chronic obstructive pulmonary disease)    (CMS-HCC)     Disorder of skin or subcutaneous tissue     Hidrodenitis Supporitiva    Dysplastic nevi     Dysthymia     Encounter for supervision of normal pregnancy in multigravida (HHS-HCC) 11/21/2012    FWB: Weekly BPPs starting @ 32-34 weeks for BMI>40. EFW: 2731 gms 6 lbs 0 oz (52%) at 34 1/7.  EFW: 3739 gms 8 lbs 4 oz (83%) at 37 6/7 GBS: neg MOD: Anticipate SVD. Feeding:  PP: LARC (IUD and*    Financial difficulties     Fractures 1992?    Fractured foot falling out of tree    Graves disease     Headache(784.0)     Headache, tension-type     Heart murmur  HL (hearing loss)     HPV (human papilloma virus) infection     Lack of access to transportation 11/15/2023    Lung disease     Copd    Obesity     OSA (obstructive sleep apnea)     Postpartum care following vaginal delivery (HHS-HCC) 04/16/2013     - Will perform routine PP care until discharge - Breastfeeding    Restless leg syndrome     Routine screening for STI (sexually transmitted infection) 04/01/2020    Scoliosis 1996    Sleep apnea     Substance abuse (CMS-HCC)     Type 2 diabetes mellitus without complication, without long-term current use of insulin    (CMS-HCC) 11/27/2019    Visual impairment    [2]   Past Surgical History:  Procedure Laterality Date    CHOLECYSTECTOMY      HYSTERECTOMY  06/2023    PR CYSTOURETHROSCOPY N/A 07/12/2023    Procedure: CYSTOURETHROSCOPY (SEPARATE PROCEDURE);  Surgeon: Voncile Genevia RAMAN, MD;  Location: [facility] OR [facility];  Service: [facility] Primary Gynecology    PR I&D OF VULVA/PERINEUM ABSCESS Right 07/12/2023    Procedure: INCISION AND DRAINAGE OF VULVA OR PERINEAL ABSCESS;  Surgeon: Voncile Genevia RAMAN, MD;  Location: [facility] OR [facility];  Service: [facility] Primary Gynecology    PR VAG HYST,RMV TUBE/OVARY,FIX ENTEROCE Midline 07/12/2023    Procedure: VAGINAL HYSTERECTOMY, FOR UTERUS 250 G OR LESS; WITH REMOVAL TUBE(S) &/OR OVARY(S), LAURIE OF ENTEROCELE;  Surgeon: Voncile Genevia RAMAN, MD;  Location: [facility] OR [facility];  Service: Womens Primary Gynecology    SPINE SURGERY  03/2013    Blood patch to try to fix epidural fail   [3]   Current Outpatient Medications:     blood sugar diagnostic (GLUCOSE BLOOD) Strp, by Other route two (2) times a day. Type 2 diabetes;  test 2 times daily, Disp: 200 strip, Rfl: 3 blood-glucose meter kit, Use as instructed, Disp: 1 each, Rfl: 0    empty container Misc, Use as directed to dispose of Cosentyx  pens., Disp: 1 each, Rfl: 2    ergocalciferol -1,250 mcg, 50,000 unit, (DRISDOL ) 1,250 mcg (50,000 unit) capsule, Take 1 capsule (1,250 mcg total) by mouth once a week., Disp: 12 capsule, Rfl: 3    ergocalciferol -1,250 mcg, 50,000 unit, (DRISDOL ) 1,250 mcg (50,000 unit) capsule, Take 1 capsule (1,250 mcg total) by mouth once a week for 12 doses., Disp: 12 capsule, Rfl: 0    ferrous fumarate  324 mg (106 mg iron ) Tab, Take 1 tablet (324 mg total) by mouth daily. (Patient not taking: Reported on 07/31/2024), Disp: 90 tablet, Rfl: 1    hydrocortisone  2.5 % ointment, Apply 1 Application topically two (2) times a day. Apply to affected area twice daily until improved or for up to 2 weeks, whichever is sooner. Break for 1-2 weeks. Restart as needed., Disp: 28.35 g, Rfl: 1    hydrOXYzine  (ATARAX ) 25 MG tablet, Take 1 tablet (25 mg total) by mouth Three (3) times a day as needed for anxiety (or sleep)., Disp: 90 tablet, Rfl: 0    ketoconazole  (NIZORAL ) 2 % cream, Apply 1 Application topically two (2) times a day., Disp: 60 g, Rfl: 5    lancets Misc, Type 2 diabetes, poorly controlled 250.02 Test 2 times daily, Disp: 100 each, Rfl: 11    metFORMIN  (GLUCOPHAGE ) 1000 MG tablet, Take 1 tablet (1,000 mg total) by mouth in the morning and 1 tablet (1,000 mg total) in the evening. Take with  meals., Disp: 180 tablet, Rfl: 1    methocarbamol  (ROBAXIN ) 750 MG tablet, Take 1 tablet (750 mg total) by mouth Three (3) times a day as needed., Disp: 60 tablet, Rfl: 0    nystatin  (MYCOSTATIN ) 100,000 unit/gram powder, Apply to affected area 2-4 times a day, Disp: 60 g, Rfl: 6    ondansetron  (ZOFRAN -ODT) 4 MG disintegrating tablet, Dissolve 1 tablet (4 mg total) in the mouth every eight (8) hours as needed for nausea., Disp: 30 tablet, Rfl: 1    secukinumab  (COSENTYX  UNOREADY PEN) 300 mg/2 mL PnIj, Inject the contents of 1 pen (300 mg) under the skin every twenty-eight (28) days., Disp: 2 mL, Rfl: 11    secukinumab  (COSENTYX  UNOREADY PEN) 300 mg/2 mL PnIj, Inject the contents of 1 pen (300 mg) under the skin at weeks 0, 1, 2, 3, and 4 followed by 300 mg every 4 weeks, Disp: 10 mL, Rfl: 0    semaglutide  (OZEMPIC ) 2 mg/dose (8 mg/3 mL) PnIj, Inject 2 mg subcutaneous once weekly (Patient not taking: Reported on 07/31/2024), Disp: 9 mL, Rfl: 3  [4]   Allergies  Allergen Reactions    Buspirone      Prozac  [Fluoxetine ]    [5]   Family History  Problem Relation Age of Onset    Hypertension Mother     Osteoporosis Mother     Tremor Mother     Depression Mother     Diabetes Maternal Aunt     COPD Father     Depression Father     Diabetes Father     Asthma Brother     Depression Brother     Rashes / Skin problems Brother     Asthma Brother     Drug abuse Brother     COPD Maternal Grandfather     Diabetes Maternal Aunt     Diabetes Maternal Uncle     Aneurysm Maternal Uncle     Diabetes Maternal Uncle     Stroke Maternal Uncle     Osteoporosis Maternal Grandmother     Stroke Maternal Grandmother     Aneurysm Paternal Grandmother     Asthma Daughter     Learning disabilities Daughter         Autistic and adhd    Asthma Brother     Diabetes Maternal Aunt     Diabetes Maternal Uncle     Stroke Maternal Uncle     Learning disabilities Son         Autistic and adhd    Melanoma Neg Hx     Squamous cell carcinoma Neg Hx     Basal cell carcinoma Neg Hx

## 2024-08-04 ENCOUNTER — Ambulatory Visit: Admit: 2024-08-04 | Discharge: 2024-08-05 | Payer: Medicaid (Managed Care)

## 2024-08-04 ENCOUNTER — Encounter
Admit: 2024-08-04 | Discharge: 2024-08-05 | Payer: Medicaid (Managed Care) | Attending: Student in an Organized Health Care Education/Training Program | Primary: Student in an Organized Health Care Education/Training Program

## 2024-08-04 DIAGNOSIS — E0500 Thyrotoxicosis with diffuse goiter without thyrotoxic crisis or storm: Principal | ICD-10-CM

## 2024-08-04 DIAGNOSIS — E038 Other specified hypothyroidism: Principal | ICD-10-CM

## 2024-08-04 LAB — T3, FREE: T3 FREE: 3.1 pg/mL (ref 2.30–4.20)

## 2024-08-04 LAB — THYROID PEROXIDASE ANTIBODY: THYROID PEROXIDASE ANTIBODIES: 138 U/mL — ABNORMAL HIGH (ref <=60)

## 2024-08-04 LAB — TSH: THYROID STIMULATING HORMONE: 5.976 u[IU]/mL — ABNORMAL HIGH (ref 0.550–4.780)

## 2024-08-04 LAB — T4, FREE: FREE T4: 1.06 ng/dL (ref 0.89–1.76)

## 2024-08-05 ENCOUNTER — Encounter
Admit: 2024-08-05 | Discharge: 2024-08-06 | Payer: Medicaid (Managed Care) | Attending: Registered" | Primary: Registered"

## 2024-08-05 NOTE — Unmapped (Unsigned)
 [facility]   Medical Nutrition Therapy Consultation       Visit Type:    Return Assessment    Referral Reason:        Kelly May is a 43 y.o. female seen for medical nutrition therapy management of type 2 diabetes and weight loss.     Her active problem list, medication list, allergies, family history, social history, health maintenance, notes from last encounter, and lab results were reviewed.     Her interim medical history is significant for Type 2 diabetes,Hypothyroidism,  Anemia, Anxiety, Arthritis, COPD, OSA and Obesity.    Anthropometrics   Height: 162.6 cm (5' 4.02)   Weight:(!) 173.9 kg (383 lb 6.4 oz)  Body mass index is 65.78 kg/m??.    Wt Readings from Last 5 Encounters:   08/05/24 (!) 173.9 kg (383 lb 6.4 oz)   08/04/24 (!) 173.9 kg (383 lb 6.4 oz)   07/31/24 (!) 175.2 kg (386 lb 4.8 oz)   07/01/24 (!) 180.3 kg (397 lb 8 oz)   06/27/24 (!) 180.3 kg (397 lb 8 oz)      Weight Change - 14 lb  Adjusted Body Weight: 102.4 kg    Ideal Body Weight: 54.53 kg  Weight in (lb) to have BMI = 25: 145.4    Nutrition Risk Screening:   Food Insecurity: Food Insecurity Present (07/31/2024)    Hunger Vital Sign     Worried About Running Out of Food in the Last Year: Sometimes true     Ran Out of Food in the Last Year: Sometimes true        Nutrition Focused Physical Exam:    Nutrition Evaluation  Overall Impressions: Unable to perform Nutrition-Focused Physical Exam at this time due to visit type -virtual (08/05/24 1348)  Nutrition Designation: Obese class III  (BMI > 39.99 kg/m2) (08/05/24 1348)     Malnutrition Screening:   Patient does not meet AND/ASPEN criteria for malnutrition at this time (08/05/24 1348)    Biochemical Data, Medical Tests and Procedures:    Lab Results   Component Value Date    A1C 7.8 (A) 06/27/2024    GLU 121 06/27/2024    TSH 5.976 (H) 08/04/2024     Lab Results   Component Value Date    CHOL 154 01/12/2023    HDL 54 01/12/2023    LDL 79 01/12/2023    NONHDL 100 01/12/2023    TRIG 104 01/12/2023     Lab Results   Component Value Date    WBC 10.7 06/27/2024    RBC 4.96 06/27/2024    HGB 13.8 06/27/2024    HCT 41.4 06/27/2024    MCV 83.4 06/27/2024    MCH 27.7 06/27/2024    MCHC 33.3 06/27/2024    RDW 15.9 (H) 06/27/2024    PLT 298 06/27/2024    MPV 9.4 06/27/2024     Lab Results   Component Value Date    IRON  39 (L) 01/12/2023    TIBC 393 01/12/2023    FERRITIN 7.7 01/12/2023     Lab Results   Component Value Date    ALKPHOS 110 06/27/2024    BILITOT 0.3 06/27/2024    PROT 7.8 06/27/2024    ALBUMIN 3.4 06/27/2024    ALT 77 (H) 06/27/2024    AST 53 (H) 06/27/2024     Medications and Vitamin/Mineral Supplementation:   All nutritionally pertinent medications/supplements reviewed on 08/05/2024.     Current Medications[1]  Nutrition History:     Dietary Restrictions: No known food allergies or food intolerances.     Gastrointestinal Issues: Diarrhea    Hunger and Satiety: Denied issues.     Food Safety and Access: food insecurity noted     Diet Recall:   Time Intake   6 am Protein shake   9 am  Fruit    1 pm  Salad with chicken - roasted    6-7 pm  Eggs. Tortilla or fish or chicken with potatoes      Food-Related History:  Beverages:  coffee, water, almond milk   Usual Food Choices: pasta, zucchini, squash,rice, blueberries, watermelon, pineapple, bananas, oranges        Eating Behaviors:  Overeating: Lacks portion control with meals and snacks.   Emotional Eating: She noted eating to deal with feelings other than hunger including boredom.  Grazing: Denied issues.   Fast Eating: Denied issues.   Nighttime Eating: None.    Physical Activity:  Physical activity level is sedentary with little to no exercise.     Estimated nutrition needs remain as previously indicated   Daily Estimated Nutritional Needs:  Energy: 2470 kcals [Per [place] St-Jeor Equation using last recorded weight, 180.3 kg (07/02/24 1216)]  Protein: 85-106 gm [0.8-1 gm/kg using adjusted body weight, 106.3 kg (07/02/24 1216)]  Carbohydrate:   [45-60% of kcal]  Fluid: 2500 mL  Fat: total fat: 20-35% of kcal, saturated fat: < 7% of kcal , trans fat: as low as possible  Fiber: 25-30 g  Sodium:  <1500 mg   Cholesterol: <200 mg  Added Sugars: <10% of kcal      Nutrition Goals & Evaluation      Apply dietary recommendations to achieve goal weight of 150 lb recommended (progressing)      Apply dietary recommendations to achieve an A1C 6.0  (ongoing)  ?   Sustainable weight loss of 5-10% (20-40) in the next 6 months (progressing)   ?   Meet estimated nutritional needs (progressing)   ?   Reduce obesity-related long-term health risk (ongoing)   ?   Nutrition goals reviewed, and relevant barriers identified and addressed: none evident. Dawna is evaluated to have a willingness and ability to achieve nutrition goals.      Nutrition Assessment       Chaise has Obesity as evidenced by a BMI of 65.78 and abnormal A1C of 7.8.  Barriers to weight loss identified and being addressed. Ruthy has made some changes to her diet since previous nutrition visit. She is having at least 3 meals per day and trying to include a source of protein with her meals or is drinking a protein shake to meet her needs. She reports a weight loss of 14 lbs.  Food history indicates usual diet is fair in protein, and inadequate in non-starchy vegetables.  Usual dietary habits and food choices reveal a lack of variety in nutrient-dense foods.    Nutrition Intervention      Nutrition Plan: continue with current nutrition plan - healthy balance diet    Keep intake consistent- aim for 5-6 small meals or at least 3 meals per day     Increase variety of nutrient dense food in diet - use handout provided for options   Include a source of protein with all meals    Increase physical activity to a goal of 150 minutes per week as able   Track intake at least 3 days per week.  Education: Healthy diet/eating style.     Review healthy options for meals and snacks. Encouraged intake consistency.     Reviewed macronutrient balance in diet and provided examples.        Education resource (s: Advice worker     Lists of foods recommended, and foods not recommended   Sample menu plan(s)    Recipes supporting nutrition intervention   Online resources     After visit summary with patient instructions    How to build a diabetic plate      Follow up will occur in 3 months.            Food/Nutrition-related history, Anthropometric measurements, Biochemical data, medical tests, procedures, Nutrition-focused physical findings, Patient understanding or compliance with intervention and recommendations , Effectiveness of nutrition interventions, and Effectiveness of nutrition prescription/order   will be assessed at time of follow-up.          Recommendations for Referring Provider :  None at this time     Patient referred to outpatient nutrition services as part of therapy/treatment plans.        The patient reports they are physically located in [place]  and is currently: at home. I conducted a audio/video visit. I spent  89m 20s on the video call with the patient. I spent an additional 20 minutes on pre- and post-visit activities on the date of service .         I am located on-site and the patient is located off-site for this visit.                    [1]   Current Outpatient Medications   Medication Sig Dispense Refill    blood sugar diagnostic (GLUCOSE BLOOD) Strp by Other route two (2) times a day. Type 2 diabetes;  test 2 times daily 200 strip 3    blood-glucose meter kit Use as instructed 1 each 0    empty container Misc Use as directed to dispose of Cosentyx  pens. 1 each 2    ergocalciferol -1,250 mcg, 50,000 unit, (DRISDOL ) 1,250 mcg (50,000 unit) capsule Take 1 capsule (1,250 mcg total) by mouth once a week. 12 capsule 3    ergocalciferol -1,250 mcg, 50,000 unit, (DRISDOL ) 1,250 mcg (50,000 unit) capsule Take 1 capsule (1,250 mcg total) by mouth once a week for 12 doses. 12 capsule 0    ferrous fumarate  324 mg (106 mg iron ) Tab Take 1 tablet (324 mg total) by mouth daily. (Patient not taking: Reported on 07/31/2024) 90 tablet 1    hydrocortisone  2.5 % ointment Apply 1 Application topically two (2) times a day. Apply to affected area twice daily until improved or for up to 2 weeks, whichever is sooner. Break for 1-2 weeks. Restart as needed. 28.35 g 1    hydrOXYzine  (ATARAX ) 25 MG tablet Take 1 tablet (25 mg total) by mouth Three (3) times a day as needed for anxiety (or sleep). 90 tablet 0    ketoconazole  (NIZORAL ) 2 % cream Apply 1 Application topically two (2) times a day. 60 g 5    lancets Misc Type 2 diabetes, poorly controlled 250.02 Test 2 times daily 100 each 11    metFORMIN  (GLUCOPHAGE ) 1000 MG tablet Take 1 tablet (1,000 mg total) by mouth in the morning and 1 tablet (1,000 mg total) in the evening. Take with meals. 180 tablet 1    methocarbamol  (ROBAXIN ) 750 MG tablet Take 1 tablet (750 mg total) by mouth Three (  3) times a day as needed. 60 tablet 0    nystatin  (MYCOSTATIN ) 100,000 unit/gram powder Apply to affected area 2-4 times a day 60 g 6    ondansetron  (ZOFRAN -ODT) 4 MG disintegrating tablet Dissolve 1 tablet (4 mg total) in the mouth every eight (8) hours as needed for nausea. 30 tablet 1    secukinumab  (COSENTYX  UNOREADY PEN) 300 mg/2 mL PnIj Inject the contents of 1 pen (300 mg) under the skin every twenty-eight (28) days. 2 mL 11    secukinumab  (COSENTYX  UNOREADY PEN) 300 mg/2 mL PnIj Inject the contents of 1 pen (300 mg) under the skin at weeks 0, 1, 2, 3, and 4 followed by 300 mg every 4 weeks 10 mL 0    semaglutide  (OZEMPIC ) 2 mg/dose (8 mg/3 mL) PnIj Inject 2 mg subcutaneous once weekly (Patient not taking: Reported on 07/31/2024) 9 mL 3     No current facility-administered medications for this visit. weeks 0, 1, 2, 3, and 4 followed by 300 mg every 4 weeks 10 mL 0    semaglutide  (OZEMPIC ) 2 mg/dose (8 mg/3 mL) PnIj Inject 2 mg subcutaneous once weekly (Patient not taking: Reported on 07/31/2024) 9 mL 3     No current facility-administered medications for this visit.

## 2024-08-06 NOTE — Unmapped (Addendum)
 Continue with current nutrition plan      Try to eat a minimum of 3 times per day or consider spreading meals out into 5-6 small meals. Try to avoid going more than 4 hours without eating.     Eat slowly. Put your fork down between bites.       Pay attention to your body. When you feel you have had enough to eat, stop.       Make sure you stay hydrated with water     Monitor portion sizes. Consider serving sizes when preparing food or snacks. (you can find serving sizes on the food label)      Increase variety of nutrient dense foods in diet. Eat a rainbow of fruits and vegetables.      Make half your plate non starchy vegetables and pair with a source of protein (3 ounces)     Consider adding legumes to your diet they are a good source of protein and fiber       Reduce/avoid excess intake of sugary beverages,  such as sodas fruit juices and sports drinks     Limit intake of ultra-processed foods and snacks, such as refined grains, chips and crackers      Enjoy fruit for dessert in place of cakes, pies or other sweets   ?   Try to avoid adding fat when cooking. Consider  boiling, baking, broiling, roasting, or grilling foods   ?   Consider tracking your intake at least 3 days per week. Apps. Like Myfitness Pal can make it easy to track

## 2024-08-07 DIAGNOSIS — M545 Chronic low back pain without sciatica, unspecified back pain laterality: Principal | ICD-10-CM

## 2024-08-07 DIAGNOSIS — G8929 Other chronic pain: Principal | ICD-10-CM

## 2024-08-07 MED ORDER — METHOCARBAMOL 750 MG TABLET
ORAL_TABLET | Freq: Three times a day (TID) | ORAL | 0 refills | 20.00000 days | PRN
Start: 2024-08-07 — End: ?

## 2024-08-07 MED ORDER — OZEMPIC 1 MG/DOSE (4 MG/3 ML) SUBCUTANEOUS PEN INJECTOR
SUBCUTANEOUS | 2 refills | 28.00000 days | Status: CP
Start: 2024-08-07 — End: 2024-11-05
  Filled 2024-08-11: qty 3, 28d supply, fill #0

## 2024-08-08 MED ORDER — METHOCARBAMOL 750 MG TABLET
ORAL_TABLET | Freq: Three times a day (TID) | ORAL | 0 refills | 20.00000 days | Status: CP | PRN
Start: 2024-08-08 — End: ?
  Filled 2024-11-04: qty 60, 20d supply, fill #0

## 2024-08-08 NOTE — Unmapped (Signed)
 Addended by: KEVEN CRUMBLY PAP on: 08/08/2024 11:21 AM     Modules accepted: Orders

## 2024-08-11 MED FILL — NYSTOP 100,000 UNIT/GRAM TOPICAL POWDER: TOPICAL | 30 days supply | Qty: 60 | Fill #1

## 2024-08-11 MED FILL — FERRETTS 325 MG (106 MG IRON) TABLET: ORAL | 60 days supply | Qty: 60 | Fill #0

## 2024-08-11 NOTE — Unmapped (Signed)
 Kelly May reports her Cosentyx  is going OK - she's had no new flaring but continues to have some drainage and inflammation at the old areas.     [facility] Clinical Assessment & Refill Coordination Note    Kelly May, DOB: 1981-09-20  Phone: [phone] (home)     All above HIPAA information was verified with patient.     Was a Nurse, learning disability used for this call? No    Specialty Medication(s):   Inflammatory Disorders: Cosentyx      Current Medications[1]     Changes to medications: Kelly May reports no changes at this time.    Medication list has been reviewed and updated in Epic: Yes    Allergies[2]    Changes to allergies: No    Allergies have been reviewed and updated in Epic: Yes    SPECIALTY MEDICATION ADHERENCE     Cosentyx  300 mg: 0 doses of medicine on hand   Medication Adherence    Patient reported X missed doses in the last month: 0  Specialty Medication: Cosentyx           Specialty medication(s) dose(s) confirmed: Regimen is correct and unchanged.     Are there any concerns with adherence? No    Adherence counseling provided? Not needed    CLINICAL MANAGEMENT AND INTERVENTION      Clinical Benefit Assessment:    Do you feel the medicine is effective or helping your condition? Unclear - no new flaring but not much change elsewhere    Clinical Benefit counseling provided? Reasonable expectations discussed: May take more time for effect - patient also with folllow up in 2 months where med may be adjusted.    Adverse Effects Assessment:    Are you experiencing any side effects? No    Are you experiencing difficulty administering your medicine? No    Quality of Life Assessment:    Quality of Life    Rheumatology  Oncology  Dermatology  1. What impact has your specialty medication had on the symptoms of your skin condition (i.e. itchiness, soreness, stinging)?: Minimal  2. What impact has your specialty medication had on your comfort level with your skin?: Minimal  Cystic Fibrosis How many days over the past month did your HS  keep you from your normal activities? For example, brushing your teeth or getting up in the morning. Patient declined to answer    Have you discussed this with your provider? Not needed    Acute Infection Status:    Acute infections noted within Epic:  No active infections    Patient reported infection: None    Therapy Appropriateness:    Is therapy appropriate based on current medication list, adverse reactions, adherence, clinical benefit and progress toward achieving therapeutic goals? Yes, therapy is appropriate and should be continued     Clinical Intervention:    Was an intervention completed as part of this clinical assessment? No    DISEASE/MEDICATION-SPECIFIC INFORMATION      For patients on injectable medications: Next injection is scheduled for 10/3, 10/31, 11/28 .    Chronic Inflammatory Diseases: Have you experienced any flares in the last month? No  Has this been reported to your provider? No    PATIENT SPECIFIC NEEDS     Does the patient have any physical, cognitive, or cultural barriers? No    Is the patient high risk? No    Does the patient require physician intervention or other additional services (i.e., nutrition, smoking cessation, social work)?  No    Does the patient have an additional or emergency contact listed in their chart? Yes    SOCIAL DETERMINANTS OF HEALTH     At the [facility], we have learned that life circumstances - like trouble affording food, housing, utilities, or transportation can affect the health of many of our patients.   That is why we wanted to ask: are you currently experiencing any life circumstances that are negatively impacting your health and/or quality of life? Patient declined to answer    Social Drivers of Health     Food Insecurity: Food Insecurity Present (07/31/2024)    Hunger Vital Sign     Worried About Running Out of Food in the Last Year: Sometimes true     Ran Out of Food in the Last Year: Sometimes true   Tobacco Use: Medium Risk (08/04/2024)    Patient History     Smoking Tobacco Use: Former     Smokeless Tobacco Use: Former     Passive Exposure: Not on Occupational hygienist Needs: Unmet Transportation Needs (07/31/2024)    PRAPARE - Therapist, art (Medical): Yes     Lack of Transportation (Non-Medical): Yes   Alcohol Use: Not on file   Housing: High Risk (07/31/2024)    Housing     Within the past 12 months, have you ever stayed: outside, in a car, in a tent, in an overnight shelter, or temporarily in someone else's home (i.e. couch-surfing)?: Yes     Are you worried about losing your housing?: Yes   Physical Activity: Inactive (01/23/2024)    Exercise Vital Sign     Days of Exercise per Week: 0 days     Minutes of Exercise per Session: 0 min   Utilities: High Risk (07/31/2024)    Utilities     Within the past 12 months, have you been unable to get utilities (heat, electricity) when it was really needed?: Yes   Stress: Stress Concern Present (01/23/2024)    [employer] of Occupational Health - Occupational Stress Questionnaire     Feeling of Stress : Rather much   Interpersonal Safety: Not At Risk (07/31/2024)    Interpersonal Safety     Unsafe Where You Currently Live: No     Physically Hurt by Anyone: No     Abused by Anyone: No   Substance Use: Not on file (10/07/2023)   Intimate Partner Violence: Not At Risk (01/23/2024)    Humiliation, Afraid, Rape, and Kick questionnaire     Fear of Current or Ex-Partner: No     Emotionally Abused: No     Physically Abused: No     Sexually Abused: No   Social Connections: Socially Isolated (01/23/2024)    Social Connection and Isolation Panel     Frequency of Communication with Friends and Family: Twice a week     Frequency of Social Gatherings with Friends and Family: Once a week     Attends Religious Services: Never     Database administrator or Organizations: No     Attends Engineer, structural: Not on file     Marital Status: Widowed Physicist, medical Strain: High Risk (01/23/2024)    Overall Financial Resource Strain (CARDIA)     Difficulty of Paying Living Expenses: Very hard   Health Literacy: Low Risk  (06/18/2022)    Health Literacy     : Never   Internet Connectivity: Not on file  Would you be willing to receive help with any of the needs that you have identified today? Not applicable       SHIPPING     Specialty Medication(s) to be Shipped:   Inflammatory Disorders: Cosentyx     Other medication(s) to be shipped: No additional medications requested for fill at this time    Specialty Medications not needed at this time: N/A     Changes to insurance: No    Cost and Payment: patient reports unable to afford copay at this time - will bill for later     Delivery Scheduled: Yes, Expected medication delivery date: 9/19.     Medication will be delivered via Same Day Courier to the confirmed prescription address in [facility].    The patient will receive a drug information handout for each medication shipped and additional FDA Medication Guides as required.  Verified that patient has previously received a Conservation officer, historic buildings and a Surveyor, mining.    The patient or caregiver noted above participated in the development of this care plan and knows that they can request review of or adjustments to the care plan at any time.      All of the patient's questions and concerns have been addressed.    Alexandra Lipps [facility] Specialty Pharmacist         [1]   Current Outpatient Medications   Medication Sig Dispense Refill    blood sugar diagnostic (GLUCOSE BLOOD) Strp by Other route two (2) times a day. Type 2 diabetes;  test 2 times daily 200 strip 3    blood-glucose meter kit Use as instructed 1 each 0    empty container Misc Use as directed to dispose of Cosentyx  pens. 1 each 2    ergocalciferol -1,250 mcg, 50,000 unit, (DRISDOL ) 1,250 mcg (50,000 unit) capsule Take 1 capsule (1,250 mcg total) by mouth once a week. 12 capsule 3    ergocalciferol -1,250 mcg, 50,000 unit, (DRISDOL ) 1,250 mcg (50,000 unit) capsule Take 1 capsule (1,250 mcg total) by mouth once a week for 12 doses. 12 capsule 0    ferrous fumarate  325 mg (106 mg iron ) Tab Take 1 tablet (325 mg) by mouth daily. 60 tablet 2    hydrocortisone  2.5 % ointment Apply 1 Application topically two (2) times a day. Apply to affected area twice daily until improved or for up to 2 weeks, whichever is sooner. Break for 1-2 weeks. Restart as needed. 28.35 g 1    hydrOXYzine  (ATARAX ) 25 MG tablet Take 1 tablet (25 mg total) by mouth Three (3) times a day as needed for anxiety (or sleep). 90 tablet 0    ketoconazole  (NIZORAL ) 2 % cream Apply 1 Application topically two (2) times a day. 60 g 5    lancets Misc Type 2 diabetes, poorly controlled 250.02 Test 2 times daily 100 each 11    metFORMIN  (GLUCOPHAGE ) 1000 MG tablet Take 1 tablet (1,000 mg total) by mouth in the morning and 1 tablet (1,000 mg total) in the evening. Take with meals. 180 tablet 1    methocarbamol  (ROBAXIN ) 750 MG tablet Take 1 tablet (750 mg total) by mouth Three (3) times a day as needed. 60 tablet 0    nystatin  (MYCOSTATIN ) 100,000 unit/gram powder Apply to affected area 2-4 times a day 60 g 6    ondansetron  (ZOFRAN -ODT) 4 MG disintegrating tablet Dissolve 1 tablet (4 mg total) on the tongue every eight (8) hours as  needed for nausea. 30 tablet 1    secukinumab  (COSENTYX  UNOREADY PEN) 300 mg/2 mL PnIj Inject the contents of 1 pen (300 mg) under the skin every twenty-eight (28) days. 2 mL 11    secukinumab  (COSENTYX  UNOREADY PEN) 300 mg/2 mL PnIj Inject the contents of 1 pen (300 mg) under the skin at weeks 0, 1, 2, 3, and 4 followed by 300 mg every 4 weeks 10 mL 0    semaglutide  (OZEMPIC ) 1 mg/dose (4 mg/3 mL) PnIj injection Inject 1 mg under the skin every seven (7) days. 3 mL 2     No current facility-administered medications for this visit.   [2]   Allergies  Allergen Reactions    Buspirone      Prozac  [Fluoxetine ]

## 2024-08-12 ENCOUNTER — Inpatient Hospital Stay: Admit: 2024-08-12 | Discharge: 2024-08-12 | Payer: Medicaid (Managed Care)

## 2024-08-13 LAB — THYROID STIMULATING IMMUNOGLOBULIN: THYROID STIMULATING IMMUNOGLOB: <1 {TSI_index}

## 2024-08-15 MED FILL — COSENTYX UNOREADY PEN 300 MG/2 ML SUBCUTANEOUS PEN INJECTOR: SUBCUTANEOUS | 84 days supply | Qty: 6 | Fill #0

## 2024-09-15 MED FILL — OZEMPIC 1 MG/DOSE (4 MG/3 ML) SUBCUTANEOUS PEN INJECTOR: SUBCUTANEOUS | 56 days supply | Qty: 6 | Fill #1

## 2024-09-15 MED FILL — ONDANSETRON 4 MG DISINTEGRATING TABLET: 10 days supply | Qty: 30 | Fill #1

## 2024-09-23 ENCOUNTER — Inpatient Hospital Stay: Admit: 2024-09-23 | Discharge: 2024-09-23 | Payer: Medicaid (Managed Care)

## 2024-09-23 MED ADMIN — gadopiclenol (ELUCIREM,VUEWAY) injection 10 mL: 10 mL | INTRAVENOUS | @ 17:00:00 | Stop: 2024-09-23

## 2024-09-30 ENCOUNTER — Inpatient Hospital Stay: Admit: 2024-09-30 | Discharge: 2024-09-30 | Payer: Medicaid (Managed Care)

## 2024-10-14 NOTE — Progress Notes (Signed)
 Dermatology Note     Assessment and Plan:      Hidradenitis Suppurativa, [place] Stage II/III - chronic, slowly improving  Previously tried: humira    - We discussed the typical natural history, pathogenesis, treatment options, and expected course as well as the relapsing and sometimes recalcitrant nature of the disease.    - Reviewed chronic, waxing/waning nature of condition and associated comorbid conditions (pilonidal cysts, IBD, psoriasis, acne, dissecting cellulitis) and potential for sinus tract formation and scarring. Discussed that combination of medical and surgical therapy is often required for treatment.   - Continue secukinumab  (COSENTYX  UNOREADY PEN) 300 mg/2 mL PnIj; Inject 300 mg under the skin every twenty-eight (28) days.     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   - Continue hydrocortisone  2.5 % ointment; Apply 1 Application topically two (2) times a day. Apply to affected area twice daily until improved or for up to 2 weeks, whichever is sooner. Break for 1-2 weeks. Restart as needed.  Dispense: 30 g; Refill: 1  - Continue ketoconazole  (NIZORAL ) 2 % cream; Apply 1 Application topically two (2) times a day.  Dispense: 60 g; Refill: 5  - Continue ketoconazole  (NIZORAL ) 2 % shampoo; Apply to scalp 2-3x weekly. Lather, leave in for 5 minutes, then rinse. Can alternate with over-the-counter anti-dandruff shampoo such as Head & Shoulders.     Intertrigo  - Diagnosis, treatment options, prognosis, risk/ benefit, and side effects of treatment were discussed with the patient.   - Start nystatin  (MYCOSTATIN ) 100,000 unit/gram ointment; Apply topically two (2) times a day. To skin folds  Dispense: 30 g; Refill: 4  - Start fluconazole  (DIFLUCAN ) 200 MG tablet; Take 1 tablet by mouth once weekly as needed for yeast flares  Dispense: 10 tablet; Refill: 1       The patient was advised to call for an appointment should any new, changing, or symptomatic lesions develop.     RTC: Return in about 6 months (around 04/15/2025) for In-person. or sooner as needed   _________________________________________________________________      Chief Complaint     Chief Complaint   Patient presents with    Hidradenitis Suppurativa     Pt coming in for HS follow up.   States no flares at the moment.     Seborrheic dermatitis     Pt coming in for Seborrheic dermatitis follow up.  States no current flares.     Pt states she would prefer the nystatin  ointment or crea.        HPI     Kelly May is a 43 y.o. female who presents as a returning patient (last seen 06/19/2024) to Dermatology for follow up of HS.     History of Present Illness  Kelly May is a 43 year old female with hidradenitis suppurativa who presents with concerns about yeast infections and medication management.    She has been managing hidradenitis suppurativa for over twenty years. She is content with her current medication regimen, which includes monthly Cosentyx  injections and weekly Ozempic .    She is experiencing an increase in yeast infections, which she attributes to Cosentyx . She uses powder for management, which provides some relief, but is interested in trying an ointment due to potential irritation from alcohol  in creams. She is not consistent with applying topical treatments and prefers oral medication for managing yeast flares, expressing interest in taking Diflucan as needed for significant flares.    She has a habit of picking at her face, which worsened after losing her teeth, a behavior that transferred from biting her nails. She acknowledges the difficulty in breaking this compulsion and has tried using pimple patches to deter picking.        The patient denies any other new or changing lesions or areas of concern. Pertinent Past Medical History     Past Medical History, Family History, Social History, Medication List, Allergies, and Problem List were reviewed in the rooming section of Epic.     ROS: Other than symptoms mentioned in the HPI, no fevers, chills, or other skin complaints    Physical Examination     GENERAL: Well-appearing female in no acute distress, resting comfortably.  NEURO: Alert and oriented, answers questions appropriately  PSYCH: Normal mood and affect  SKIN: Examination of the axillae, hair, chest, and abdomen was performed  - flaking of scalp  - erythematous patches of bilateral inframammary folds  - multiple nondraining sinus tracts and noninflamed nodules of the bilateral axillae      All areas not commented on are within normal limits or unremarkable.           (Approved Template 08/09/2020)

## 2024-10-16 DIAGNOSIS — L732 Hidradenitis suppurativa: Principal | ICD-10-CM

## 2024-10-16 DIAGNOSIS — L304 Erythema intertrigo: Principal | ICD-10-CM

## 2024-10-16 DIAGNOSIS — Z79899 Other long term (current) drug therapy: Principal | ICD-10-CM

## 2024-10-16 DIAGNOSIS — L219 Seborrheic dermatitis, unspecified: Principal | ICD-10-CM

## 2024-10-16 MED ORDER — NYSTATIN 100,000 UNIT/GRAM TOPICAL OINTMENT
Freq: Two times a day (BID) | TOPICAL | 4 refills | 0.00000 days | Status: CP
Start: 2024-10-16 — End: 2025-10-16
  Filled 2024-11-04: qty 30, 30d supply, fill #0

## 2024-10-16 MED ORDER — FLUCONAZOLE 200 MG TABLET
ORAL_TABLET | ORAL | 1 refills | 0.00000 days | Status: CP
Start: 2024-10-16 — End: ?
  Filled 2024-11-04: qty 4, 28d supply, fill #0

## 2024-10-16 NOTE — Patient Instructions (Addendum)
 Meet your team:     Your intake nurse is: Rosalia Colonel     Please remember to fill out the survey you will receive after your visit. Your comments help us  continue to improve our care.      Thanks in advance!      [facility] Dermatology Clinical Staff

## 2024-10-30 DIAGNOSIS — E119 Type 2 diabetes mellitus without complications: Principal | ICD-10-CM

## 2024-10-30 DIAGNOSIS — E66813 Class 3 severe obesity due to excess calories with body mass index (BMI) of 50.0 to 59.9 in adult, unspecified whether serious comorbidity present (CMS-HCC): Principal | ICD-10-CM

## 2024-10-30 DIAGNOSIS — G4733 Obstructive sleep apnea (adult) (pediatric): Principal | ICD-10-CM

## 2024-10-30 DIAGNOSIS — Z6841 Body Mass Index (BMI) 40.0 and over, adult: Principal | ICD-10-CM

## 2024-10-30 MED ORDER — SEMAGLUTIDE 2 MG/DOSE (8 MG/3 ML) SUBCUTANEOUS PEN INJECTOR
SUBCUTANEOUS | 1 refills | 84.00000 days | Status: CP
Start: 2024-10-30 — End: 2025-04-28
  Filled 2024-11-04: qty 9, 84d supply, fill #0

## 2024-10-31 NOTE — Progress Notes (Signed)
 Pt cancelled request . Found 1 Pen in fridge. Due today 12.5.25

## 2024-11-04 ENCOUNTER — Encounter
Admit: 2024-11-04 | Discharge: 2024-11-05 | Payer: Medicaid (Managed Care) | Attending: Registered" | Primary: Registered"

## 2024-11-04 DIAGNOSIS — E669 Obesity, unspecified: Principal | ICD-10-CM

## 2024-11-04 MED FILL — METFORMIN 1,000 MG TABLET: ORAL | 90 days supply | Qty: 180 | Fill #0

## 2024-11-04 MED FILL — ERGOCALCIFEROL (VITAMIN D2) 1,250 MCG (50,000 UNIT) CAPSULE: ORAL | 84 days supply | Qty: 12 | Fill #0

## 2024-11-04 MED FILL — NYSTOP 100,000 UNIT/GRAM TOPICAL POWDER: TOPICAL | 30 days supply | Qty: 60 | Fill #2

## 2024-11-04 NOTE — Patient Instructions (Addendum)
 Continue with current nutrition plan     Aim for 5-6 small meals or at least 3 meals per day     Increase variety of nutrient dense food in diet     Include a source of protein with all meals    Increase physical activity to a goal of 150 minutes per week as able     Tips for shopping Healthy on a Budget   Compare national brands and private store labels for the lowest price.   Use your weekly eating plan to create a master grocery list and stick to it.   Prioritize your food dollars for nutrient-rich vegetables, fruits, low-fat dairy, lean protein foods and whole grains.   To keep your grocery list from growing too long, prepare meals that include similar ingredients throughout the week.  Shop seasonally for fresh fruit and vegetables. Local, seasonal produce is at its peak flavor and is generally more abundant, so it's usually sold at a lower price.  If the produce item you want isn't in season or doesn't fit in your budget, consider purchasing it frozen or canned.  Canned foods offer an alternative to fresh and may be more budget friendly. Be sure to check the ingredients list to avoid items with added sugars or salt.  Consider purchasing a larger quantity of meat that is on sale and preparing enough for two or more meals.   Enjoy leftovers later in the week or freeze for future use.   Because meat is often the highest dollar ingredient in a recipe, consider planning meatless meals a few nights each week.   Incorporate more non-meat proteins, including beans, nuts and eggs, can be cost-effective and nutritious.  Stock up on these nonperishable items when on sale or take advantage of the bulk bin by purchasing only the amount you need.   Dried beans, peas and lentils are great options to keep on hand. You can buy in bulk, prepare ahead of time and then freeze so you always have protein and fiber-rich foods on hand.   Plan to use highly perishable items -- such as fish and seafood, salad greens, berries and fresh herbs -- early in the week, and save more hearty items for later in the week.   Enjoy leftovers for lunch or create new meals from leftover ingredients. Cooked meat and vegetables can be revamped as a filling for a casserole, frittata or soup.    Chewing difficulty   Soft textured foods     Soft foods include moist, tender, and easily mashed items like cooked eggs, mashed potatoes, oatmeal, soft fruits such as bananas and ripe peaches, and tender, shredded meats or fish. You can also eat pureed or blended soups, yogurt, cottage cheese, and soft bread or pasta.   Fruits and vegetables  Fruits: Bananas, ripe peaches, seedless melon, avocados, and canned or cooked fruits without skins or seeds.  Vegetables: Mashed potatoes cooked or canned vegetables without skins (like carrots and peas), and finely chopped or pureed cooked vegetables such as spinach.  Soups: Broths and pureed or blended soups.   Proteins  Eggs: Soft-boiled or scrambled eggs.  Fish: Flaky fish like salmon or tuna (canned or cooked) without bones.  Meats: Tender, shredded, or ground meats such as chicken, turkey, or beef.  Other: Tofu, well-cooked beans or lentils (mashed), and hummus.   Grains and starches   Cereals: Oatmeal, Cream of Wheat, and grits.  Breads: Soft breads, rolls, or muffins.  Pasta and rice: soft pasta,  rice.   Dairy and desserts  Dairy: Yogurt, cottage cheese, and soft cheeses.  Desserts: Pudding, custard, gelatin

## 2024-11-04 NOTE — Progress Notes (Signed)
 [facility] Outpatient Nutrition Services   Medical Nutrition Therapy Consultation       Visit Type:    Return Assessment    Referral Reason:        Kelly May is a 43 y.o. female seen for medical nutrition therapy for weight loss     Her active problem list, medication list, allergies, family history, social history, health maintenance, notes from last encounter, and lab results were reviewed.     Her interim medical history is significant for Type 2 diabetes, Anemia, Anxiety, Arthritis, COPD, OSA and Obesity.    Anthropometrics   Height: 162.6 cm (5' 4.02)   Weight:(!) 173.9 kg (383 lb 6.1 oz)  Body mass index is 65.77 kg/m??.    Wt Readings from Last 5 Encounters:   11/04/24 (!) 173.9 kg (383 lb 6.1 oz)   09/23/24 (!) 173.9 kg (383 lb 6.1 oz)   08/05/24 (!) 173.9 kg (383 lb 6.4 oz)   08/04/24 (!) 173.9 kg (383 lb 6.4 oz)   07/31/24 (!) 175.2 kg (386 lb 4.8 oz)        Adjusted Body Weight:102.4 kg  Ideal Body Weight: 54.53 kg  Weight in (lb) to have BMI = 25: 145.4    Nutrition Risk Screening:   Food Insecurity: Food Insecurity Present (07/31/2024)    Hunger Vital Sign     Worried About Running Out of Food in the Last Year: Sometimes true     Ran Out of Food in the Last Year: Sometimes true        Nutrition Focused Physical Exam:    Nutrition Evaluation  Overall Impressions: Unable to perform Nutrition-Focused Physical Exam at this time due to visit type-virtual (11/04/24 1121)  Nutrition Designation: Obese class III  (BMI > 39.99 kg/m2) (11/04/24 1121)     Malnutrition Screening:   Patient does not meet AND/ASPEN criteria for malnutrition at this time (11/04/24 1122)    Biochemical Data, Medical Tests and Procedures:    Lab Results   Component Value Date    A1C 7.8 (A) 06/27/2024    GLU 121 06/27/2024    TSH 5.976 (H) 08/04/2024     Lab Results   Component Value Date    CHOL 154 01/12/2023    HDL 54 01/12/2023    LDL 79 01/12/2023    NONHDL 100 01/12/2023    TRIG 104 01/12/2023     Lab Results   Component Value Date    WBC 10.7 06/27/2024    RBC 4.96 06/27/2024    HGB 13.8 06/27/2024    HCT 41.4 06/27/2024    MCV 83.4 06/27/2024    MCH 27.7 06/27/2024    MCHC 33.3 06/27/2024    RDW 15.9 (H) 06/27/2024    PLT 298 06/27/2024    MPV 9.4 06/27/2024     Lab Results   Component Value Date    IRON  39 (L) 01/12/2023    TIBC 393 01/12/2023    FERRITIN 7.7 01/12/2023     Lab Results   Component Value Date    ALKPHOS 110 06/27/2024    BILITOT 0.3 06/27/2024    PROT 7.8 06/27/2024    ALBUMIN 3.4 06/27/2024    ALT 77 (H) 06/27/2024    AST 53 (H) 06/27/2024     Medications and Vitamin/Mineral Supplementation:   All nutritionally pertinent medications/supplements reviewed on 11/04/2024.     Current Medications[1]    Nutrition History:     Dietary Restrictions: No known food allergies or  food intolerances.     Gastrointestinal Issues: Diarrhea Constipation    Hunger and Satiety: Denied issues.     Food Safety and Access: food insecurity noted     Diet Recall:   Time Intake   12 pm  Sandwich - ham and cheese or peanut butter    6-8 pm  Hot dogs or BBQ pork, corn bread, slaw      Food-Related History:  Snacks:  grapes or blueberries  Beverages:  water, almond milk  Dining Out:  once per month  Usual Food Choices: tortilla, chips, salsa, chicken, pasta    Meal Schedule:  2 meals, 1-2 snacks       Eating Behaviors:  Overeating: Denied issues with overeating.   Emotional Eating: Denied issues.   Grazing: Denied issues.   Fast Eating: Denied issues.   Nighttime Eating: None.    Physical Activity:  Physical activity level is light with some exercise.   Using exercise bands 3 times per week for 20 minutes     Estimated nutrition needs remain as previously indicated   Daily Estimated Nutritional Needs:  Energy: 2470 kcals [Per [place] [employer]-Jeor Equation using last recorded weight, 180.3 kg (07/02/24 1216)]  Protein: 85-106 gm [0.8-1 gm/kg using adjusted body weight, 106.3 kg (07/02/24 1216)]  Carbohydrate:   [45-60% of kcal]  Fluid: 2500 [ ]   Fat: total fat: 20-35% of kcal, saturated fat: < 7% of kcal , trans fat: as low as possible  Fiber: 25-30 g  Sodium:  <1500 mg   Cholesterol: <200 mg  Added Sugars: <10% of kcal      Nutrition Goals & Evaluation      Apply dietary recommendations to achieve goal weight of 150 lb recommended (ongoing)      Apply dietary recommendations to achieve an A1C 6.0  (ongoing)  ?   Sustainable weight loss of 5-10% (20-40) in the next 6 months (ongoing)   ?   Meet estimated nutritional needs (ongoing)   ?   Reduce obesity-related long-term health risk (ongoing)   ?   Nutrition goals reviewed, and relevant barriers identified and addressed: nutrition knowledge deficit. Kelly May is evaluated to have a willingness and ability to achieve nutrition goals.     Nutrition Assessment       Kelly May has a BMI of 66.77 and abnormal A1C. Barriers to weight loss include metabolic effects of consumption of processed food and suboptimal physical activity.  Food history indicates usual diet is inadequate in protein, and non-starchy vegetables.    Usual dietary habits and food choices reveal a lack of variety in nutrient-dense foods and inadequate intake. Physical activity level is light with some exercise.    Nutrition Intervention      Nutrition Plan: continue with current nutrition plan -healthy balance diet    Meet daily estimated needs    Aim for 5-6 small meals or at least 3 meals per day     Increase variety of nutrient dense food in diet     Include a source of protein with all meals    Increase physical activity to a goal of 150 minutes per week as able   Consider tracking your intake at least 3 days per week.      Education: Healthy diet/eating style.     Review healthy options for meals and snacks.     Discussed meal pattern and encouraged intake consistency.     Provided recommendations for eating on the budget  Reviewed macronutrient balance in diet  and provided examples.         Patient acknowledge understanding nutrition plan as presented.        Education resource (s: Advice worker     Lists of foods recommended, and foods not recommended   Sample menu plan(s)    Recipes supporting nutrition intervention   Online resources     After visit summary with patient instructions    How to build a diabetic plate        Follow up will occur PRN. Dietitian encouraged patient to reach out via MyChart with any questions.    Food/Nutrition-related history, Anthropometric measurements, Biochemical data, medical tests, procedures, Nutrition-focused physical findings, Patient understanding or compliance with intervention and recommendations , Effectiveness of nutrition interventions, and Effectiveness of nutrition prescription/order   will be assessed at time of follow-up.      Recommendations for Referring Provider :  None      Patient referred to outpatient nutrition services as part of therapy/treatment plans.        The patient reports they are physically located in [place]  and is currently: at home. I conducted a audio/video visit. I spent  75m 41s on the video call with the patient. I spent an additional 20 minutes on pre- and post-visit activities on the date of service .         I am located on-site and the patient is located off-site for this visit.                    [1]   Current Outpatient Medications   Medication Sig Dispense Refill    blood-glucose meter kit Use as instructed 1 each 0    empty container Misc Use as directed to dispose of Cosentyx  pens. 1 each 2    ergocalciferol  (DRISDOL ) 1,250 mcg (50,000 unit) capsule Take 1 capsule (1,250 mcg total) by mouth once a week. 12 capsule 3    ferrous fumarate  325 mg (106 mg iron ) Tab Take 1 tablet (325 mg) by mouth daily. 60 tablet 2    fluconazole  (DIFLUCAN ) 200 MG tablet Take 1 tablet by mouth once weekly as needed for yeast flares 10 tablet 1    hydrocortisone  2.5 % ointment Apply 1 Application topically two (2) times a day. Apply to affected area twice daily until improved or for up to 2 weeks, whichever is sooner. Break for 1-2 weeks. Restart as needed. 28.35 g 1    ketoconazole  (NIZORAL ) 2 % cream Apply 1 Application topically two (2) times a day. 60 g 5    lancets Misc Type 2 diabetes, poorly controlled 250.02 Test 2 times daily 100 each 11    metFORMIN  (GLUCOPHAGE ) 1000 MG tablet Take 1 tablet (1,000 mg total) by mouth in the morning and 1 tablet (1,000 mg total) in the evening. Take with meals. 180 tablet 1    methocarbamol  (ROBAXIN ) 750 MG tablet Take 1 tablet (750 mg total) by mouth Three (3) times a day as needed. 60 tablet 0    nystatin  (MYCOSTATIN ) 100,000 unit/gram ointment Apply topically two (2) times a day. To skin folds 30 g 4    nystatin  (MYCOSTATIN ) 100,000 unit/gram powder Apply to affected area 2-4 times a day 60 g 6    secukinumab  (COSENTYX  UNOREADY PEN) 300 mg/2 mL PnIj Inject the contents of 1 pen (300 mg) under the skin every twenty-eight (28) days. 2 mL 11    secukinumab  (COSENTYX  UNOREADY PEN) 300 mg/2 mL PnIj Inject  the contents of 1 pen (300 mg) under the skin at weeks 0, 1, 2, 3, and 4 followed by 300 mg every 4 weeks 10 mL 0    semaglutide  (OZEMPIC ) 2 mg/dose (8 mg/3 mL) PnIj Inject 2 mg under the skin every seven (7) days. 9 mL 1     No current facility-administered medications for this visit.

## 2024-12-04 DIAGNOSIS — R110 Nausea: Principal | ICD-10-CM

## 2024-12-04 MED ORDER — ONDANSETRON 4 MG DISINTEGRATING TABLET
ORAL_TABLET | Freq: Three times a day (TID) | 1 refills | 10.00000 days | PRN
Start: 2024-12-04 — End: 2025-01-03

## 2024-12-04 NOTE — Progress Notes (Signed)
 [facility] Refill Coordination Note    Specialty Medication(s) to be Shipped:   Inflammatory Disorders: Cosentyx     Other medication(s) to be shipped: No additional medications requested for fill at this time    Specialty Medications not needed at this time: N/A     Kelly May, DOB: [birth year]  Phone: [phone] (home)       All above HIPAA information was verified with patient.     Was a nurse, learning disability used for this call? No    Completed refill call assessment today to schedule patient's medication shipment from the [facility]  [phone]).  All relevant notes have been reviewed.     Specialty medication(s) and dose(s) confirmed: Regimen is correct and unchanged.   Changes to medications: Kelly May reports no changes at this time.  Changes to insurance: No  New side effects reported not previously addressed with a pharmacist or physician: None reported  Questions for the pharmacist: No    Confirmed patient received a Conservation Officer, Historic Buildings and a Surveyor, Mining with first shipment. The patient will receive a drug information handout for each medication shipped and additional FDA Medication Guides as required.       DISEASE/MEDICATION-SPECIFIC INFORMATION        N/A    SPECIALTY MEDICATION ADHERENCE     Medication Adherence    Patient reported X missed doses in the last month: 0  Specialty Medication: COSENTYX  UNOREADY PEN 300 mg/2 mL Pnij (secukinumab )  Patient is on additional specialty medications: No              Were doses missed due to medication being on hold? No     COSENTYX  UNOREADY PEN 300 mg/2 mL Pnij (secukinumab ): 0 doses of medicine on hand       Specialty medication is an injection or given on a cycle: Yes, Next injection is scheduled for 11/28/24.    REFERRAL TO PHARMACIST     Referral to the pharmacist: Not needed      [facility]     Shipping address confirmed in Epic.     Cost and Payment: Patient has a copay of $4. The patient has Medicaid and has expressed that they are unable to provide payment at this time. The patient will be sent a bill for them to pay a later date.    Delivery Scheduled: Yes, Expected medication delivery date: 12/06/24.     Medication will be delivered via Next Day Courier to the prescription address in Epic WAM.    Dena LOISE Dolores   [facility]  Specialty Technician

## 2024-12-05 MED ORDER — ONDANSETRON 4 MG DISINTEGRATING TABLET
ORAL_TABLET | Freq: Three times a day (TID) | 1 refills | 10.00000 days | PRN
Start: 2024-12-05 — End: 2025-01-04

## 2024-12-05 MED FILL — COSENTYX UNOREADY PEN 300 MG/2 ML SUBCUTANEOUS PEN INJECTOR: SUBCUTANEOUS | 84 days supply | Qty: 6 | Fill #1

## 2024-12-05 NOTE — Telephone Encounter (Signed)
 Patient is requesting the following refill  Requested Prescriptions     Pending Prescriptions Disp Refills    ondansetron  (ZOFRAN -ODT) 4 MG disintegrating tablet 30 tablet 1     Sig: Dissolve 1 tablet (4 mg total) on the tongue every eight (8) hours as needed for nausea.       Recent Visits  Date Type Provider Dept   07/31/24 Office Visit Mangel, Benison Pap, DO [facility] [address] [facility]   06/27/24 Office Visit Mangel, Benison Pap, DO [facility] [address] [facility]   Showing recent visits within past 365 days and meeting all other requirements  Future Appointments  No visits were found meeting these conditions.  Showing future appointments within next 365 days and meeting all other requirements       Labs: Not applicable this refill

## 2024-12-26 MED ORDER — ONDANSETRON 4 MG DISINTEGRATING TABLET
ORAL_TABLET | Freq: Three times a day (TID) | ORAL | 2 refills | 10.00000 days | Status: CP | PRN
Start: 2024-12-26 — End: 2025-01-05
